# Patient Record
Sex: Male | Born: 1955 | Race: White | Hispanic: No | Marital: Married | Smoking: Former smoker
Health system: Southern US, Community
[De-identification: ages and names within clinical notes are randomized; demographics above are authoritative.]

## PROBLEM LIST (undated history)

## (undated) DIAGNOSIS — I739 Peripheral vascular disease, unspecified: Secondary | ICD-10-CM

## (undated) DIAGNOSIS — I10 Essential (primary) hypertension: Secondary | ICD-10-CM

## (undated) DIAGNOSIS — J942 Hemothorax: Secondary | ICD-10-CM

## (undated) DIAGNOSIS — I639 Cerebral infarction, unspecified: Secondary | ICD-10-CM

## (undated) DIAGNOSIS — E119 Type 2 diabetes mellitus without complications: Secondary | ICD-10-CM

## (undated) DIAGNOSIS — J9611 Chronic respiratory failure with hypoxia: Secondary | ICD-10-CM

## (undated) DIAGNOSIS — I4891 Unspecified atrial fibrillation: Secondary | ICD-10-CM

## (undated) DIAGNOSIS — I5032 Chronic diastolic (congestive) heart failure: Secondary | ICD-10-CM

## (undated) DIAGNOSIS — E785 Hyperlipidemia, unspecified: Secondary | ICD-10-CM

## (undated) DIAGNOSIS — F1010 Alcohol abuse, uncomplicated: Secondary | ICD-10-CM

## (undated) DIAGNOSIS — J449 Chronic obstructive pulmonary disease, unspecified: Secondary | ICD-10-CM

## (undated) HISTORY — DX: Chronic diastolic (congestive) heart failure: I50.32

## (undated) HISTORY — DX: Alcohol abuse, uncomplicated: F10.10

## (undated) HISTORY — DX: Unspecified atrial fibrillation: I48.91

## (undated) HISTORY — DX: Peripheral vascular disease, unspecified: I73.9

## (undated) HISTORY — DX: Chronic respiratory failure with hypoxia: J96.11

## (undated) HISTORY — PX: THORACOTOMY: SUR1349

## (undated) HISTORY — DX: Type 2 diabetes mellitus without complications: E11.9

## (undated) HISTORY — DX: Hyperlipidemia, unspecified: E78.5

## (undated) HISTORY — DX: Hemothorax: J94.2

## (undated) HISTORY — DX: Essential (primary) hypertension: I10

## (undated) HISTORY — DX: Cerebral infarction, unspecified: I63.9

## (undated) HISTORY — DX: Chronic obstructive pulmonary disease, unspecified: J44.9

---

## 2008-08-02 ENCOUNTER — Ambulatory Visit: Payer: Self-pay | Admitting: Cardiology

## 2008-08-04 ENCOUNTER — Encounter: Payer: Self-pay | Admitting: Cardiology

## 2008-08-16 ENCOUNTER — Encounter: Payer: Self-pay | Admitting: Cardiology

## 2008-08-18 ENCOUNTER — Ambulatory Visit: Payer: Self-pay | Admitting: Cardiology

## 2008-08-18 ENCOUNTER — Inpatient Hospital Stay (HOSPITAL_COMMUNITY): Admission: AD | Admit: 2008-08-18 | Discharge: 2008-09-01 | Payer: Self-pay | Admitting: Thoracic Surgery

## 2008-08-18 ENCOUNTER — Ambulatory Visit: Payer: Self-pay | Admitting: Pulmonary Disease

## 2008-08-18 ENCOUNTER — Ambulatory Visit: Payer: Self-pay | Admitting: Thoracic Surgery

## 2008-08-18 HISTORY — PX: OTHER SURGICAL HISTORY: SHX169

## 2008-08-19 ENCOUNTER — Encounter: Payer: Self-pay | Admitting: Thoracic Surgery

## 2008-08-22 ENCOUNTER — Ambulatory Visit: Payer: Self-pay | Admitting: Physical Medicine & Rehabilitation

## 2008-08-22 ENCOUNTER — Encounter: Payer: Self-pay | Admitting: Thoracic Surgery

## 2008-09-01 ENCOUNTER — Inpatient Hospital Stay (HOSPITAL_COMMUNITY)
Admission: RE | Admit: 2008-09-01 | Discharge: 2008-09-10 | Payer: Self-pay | Admitting: Physical Medicine & Rehabilitation

## 2008-09-01 ENCOUNTER — Ambulatory Visit: Payer: Self-pay | Admitting: Internal Medicine

## 2008-09-16 ENCOUNTER — Ambulatory Visit: Payer: Self-pay | Admitting: Thoracic Surgery

## 2008-09-16 ENCOUNTER — Encounter: Admission: RE | Admit: 2008-09-16 | Discharge: 2008-09-16 | Payer: Self-pay | Admitting: Thoracic Surgery

## 2008-09-25 DIAGNOSIS — F341 Dysthymic disorder: Secondary | ICD-10-CM | POA: Insufficient documentation

## 2008-09-25 DIAGNOSIS — J449 Chronic obstructive pulmonary disease, unspecified: Secondary | ICD-10-CM

## 2008-09-25 DIAGNOSIS — I4819 Other persistent atrial fibrillation: Secondary | ICD-10-CM

## 2008-09-25 DIAGNOSIS — J4489 Other specified chronic obstructive pulmonary disease: Secondary | ICD-10-CM | POA: Insufficient documentation

## 2008-09-25 DIAGNOSIS — I6350 Cerebral infarction due to unspecified occlusion or stenosis of unspecified cerebral artery: Secondary | ICD-10-CM | POA: Insufficient documentation

## 2008-09-25 DIAGNOSIS — I10 Essential (primary) hypertension: Secondary | ICD-10-CM

## 2008-09-25 DIAGNOSIS — E119 Type 2 diabetes mellitus without complications: Secondary | ICD-10-CM | POA: Insufficient documentation

## 2008-09-25 DIAGNOSIS — K219 Gastro-esophageal reflux disease without esophagitis: Secondary | ICD-10-CM

## 2008-09-25 DIAGNOSIS — D649 Anemia, unspecified: Secondary | ICD-10-CM

## 2008-09-25 DIAGNOSIS — E669 Obesity, unspecified: Secondary | ICD-10-CM | POA: Insufficient documentation

## 2008-09-29 ENCOUNTER — Encounter: Payer: Self-pay | Admitting: Cardiology

## 2008-09-29 ENCOUNTER — Ambulatory Visit: Payer: Self-pay | Admitting: Cardiology

## 2008-09-29 DIAGNOSIS — E785 Hyperlipidemia, unspecified: Secondary | ICD-10-CM

## 2008-09-29 DIAGNOSIS — I5032 Chronic diastolic (congestive) heart failure: Secondary | ICD-10-CM

## 2008-10-15 ENCOUNTER — Ambulatory Visit: Payer: Self-pay | Admitting: Cardiology

## 2008-10-15 ENCOUNTER — Ambulatory Visit: Payer: Self-pay | Admitting: Internal Medicine

## 2008-10-15 ENCOUNTER — Ambulatory Visit: Payer: Self-pay | Admitting: Thoracic Surgery

## 2008-10-15 ENCOUNTER — Encounter: Admission: RE | Admit: 2008-10-15 | Discharge: 2008-10-15 | Payer: Self-pay | Admitting: Thoracic Surgery

## 2008-10-15 LAB — CONVERTED CEMR LAB
ALT: 31 units/L (ref 0–53)
BUN: 20 mg/dL (ref 6–23)
CO2: 32 meq/L (ref 19–32)
Chloride: 100 meq/L (ref 96–112)
Creatinine, Ser: 1 mg/dL (ref 0.4–1.5)
Glucose, Bld: 240 mg/dL — ABNORMAL HIGH (ref 70–99)
TSH: 1.81 microintl units/mL (ref 0.35–5.50)
Total Protein: 6.7 g/dL (ref 6.0–8.3)

## 2008-10-17 ENCOUNTER — Encounter
Admission: RE | Admit: 2008-10-17 | Discharge: 2008-10-21 | Payer: Self-pay | Admitting: Physical Medicine & Rehabilitation

## 2008-10-21 ENCOUNTER — Encounter: Payer: Self-pay | Admitting: Cardiology

## 2008-10-21 ENCOUNTER — Ambulatory Visit: Payer: Self-pay | Admitting: Cardiology

## 2008-10-21 ENCOUNTER — Ambulatory Visit: Payer: Self-pay | Admitting: Physical Medicine & Rehabilitation

## 2008-10-28 ENCOUNTER — Ambulatory Visit: Payer: Self-pay | Admitting: Cardiology

## 2008-11-04 ENCOUNTER — Ambulatory Visit: Payer: Self-pay | Admitting: Cardiology

## 2008-11-07 ENCOUNTER — Telehealth: Payer: Self-pay | Admitting: Cardiology

## 2008-11-11 ENCOUNTER — Ambulatory Visit: Payer: Self-pay | Admitting: Cardiology

## 2008-11-17 ENCOUNTER — Encounter (INDEPENDENT_AMBULATORY_CARE_PROVIDER_SITE_OTHER): Payer: Self-pay | Admitting: *Deleted

## 2008-11-17 DIAGNOSIS — J984 Other disorders of lung: Secondary | ICD-10-CM

## 2008-11-18 ENCOUNTER — Ambulatory Visit: Payer: Self-pay | Admitting: Cardiology

## 2008-11-27 ENCOUNTER — Ambulatory Visit: Payer: Self-pay | Admitting: Pulmonary Disease

## 2008-12-02 ENCOUNTER — Ambulatory Visit: Payer: Self-pay | Admitting: Cardiology

## 2008-12-16 ENCOUNTER — Ambulatory Visit: Payer: Self-pay | Admitting: Cardiology

## 2008-12-17 ENCOUNTER — Encounter: Payer: Self-pay | Admitting: Cardiology

## 2008-12-17 ENCOUNTER — Ambulatory Visit: Payer: Self-pay | Admitting: Thoracic Surgery

## 2008-12-17 ENCOUNTER — Encounter: Admission: RE | Admit: 2008-12-17 | Discharge: 2008-12-17 | Payer: Self-pay | Admitting: Thoracic Surgery

## 2008-12-17 ENCOUNTER — Ambulatory Visit: Payer: Self-pay | Admitting: Cardiology

## 2008-12-30 ENCOUNTER — Encounter: Payer: Self-pay | Admitting: Cardiology

## 2009-01-02 ENCOUNTER — Telehealth (INDEPENDENT_AMBULATORY_CARE_PROVIDER_SITE_OTHER): Payer: Self-pay | Admitting: *Deleted

## 2009-01-06 ENCOUNTER — Ambulatory Visit: Payer: Self-pay | Admitting: Cardiology

## 2009-01-06 ENCOUNTER — Encounter: Payer: Self-pay | Admitting: Cardiology

## 2009-01-08 ENCOUNTER — Telehealth: Payer: Self-pay | Admitting: Cardiology

## 2009-01-27 ENCOUNTER — Encounter: Payer: Self-pay | Admitting: Cardiology

## 2009-02-03 ENCOUNTER — Telehealth: Payer: Self-pay | Admitting: Cardiology

## 2009-02-03 ENCOUNTER — Ambulatory Visit: Payer: Self-pay | Admitting: Cardiology

## 2009-02-16 ENCOUNTER — Encounter: Payer: Self-pay | Admitting: *Deleted

## 2009-02-16 ENCOUNTER — Telehealth: Payer: Self-pay | Admitting: Cardiology

## 2009-02-24 ENCOUNTER — Ambulatory Visit: Payer: Self-pay | Admitting: Cardiology

## 2009-02-24 LAB — CONVERTED CEMR LAB: Prothrombin Time: 15.9 s

## 2009-03-10 ENCOUNTER — Ambulatory Visit: Payer: Self-pay | Admitting: Cardiology

## 2009-03-17 ENCOUNTER — Encounter: Payer: Self-pay | Admitting: Cardiology

## 2009-03-24 ENCOUNTER — Ambulatory Visit: Payer: Self-pay | Admitting: Cardiology

## 2009-04-07 ENCOUNTER — Ambulatory Visit: Payer: Self-pay | Admitting: Cardiology

## 2009-04-07 LAB — CONVERTED CEMR LAB: POC INR: 1.8

## 2009-04-14 ENCOUNTER — Encounter: Payer: Self-pay | Admitting: Cardiology

## 2009-04-28 ENCOUNTER — Ambulatory Visit: Payer: Self-pay

## 2009-04-28 ENCOUNTER — Encounter: Payer: Self-pay | Admitting: Cardiology

## 2009-05-06 ENCOUNTER — Encounter: Payer: Self-pay | Admitting: Cardiology

## 2009-05-06 ENCOUNTER — Ambulatory Visit: Payer: Self-pay

## 2009-05-19 ENCOUNTER — Ambulatory Visit: Payer: Self-pay | Admitting: Cardiology

## 2009-05-22 ENCOUNTER — Encounter: Payer: Self-pay | Admitting: Cardiology

## 2009-06-09 ENCOUNTER — Ambulatory Visit: Payer: Self-pay | Admitting: Cardiology

## 2009-06-09 LAB — CONVERTED CEMR LAB: POC INR: 2.3

## 2009-07-07 ENCOUNTER — Ambulatory Visit: Payer: Self-pay | Admitting: Cardiology

## 2009-07-07 LAB — CONVERTED CEMR LAB: POC INR: 2.8

## 2009-08-04 ENCOUNTER — Ambulatory Visit: Payer: Self-pay | Admitting: Cardiology

## 2009-08-05 ENCOUNTER — Telehealth: Payer: Self-pay | Admitting: Cardiology

## 2009-09-01 ENCOUNTER — Ambulatory Visit: Payer: Self-pay | Admitting: Cardiology

## 2009-09-01 LAB — CONVERTED CEMR LAB: POC INR: 2.3

## 2009-09-17 ENCOUNTER — Encounter (INDEPENDENT_AMBULATORY_CARE_PROVIDER_SITE_OTHER): Payer: Self-pay | Admitting: *Deleted

## 2009-10-06 ENCOUNTER — Ambulatory Visit: Payer: Self-pay | Admitting: Cardiology

## 2009-10-06 LAB — CONVERTED CEMR LAB: POC INR: 2.3

## 2009-11-03 ENCOUNTER — Ambulatory Visit: Payer: Self-pay | Admitting: Cardiology

## 2009-11-13 ENCOUNTER — Ambulatory Visit: Payer: Self-pay | Admitting: Cardiology

## 2009-12-08 ENCOUNTER — Ambulatory Visit: Payer: Self-pay | Admitting: Cardiology

## 2009-12-15 ENCOUNTER — Telehealth: Payer: Self-pay | Admitting: Cardiology

## 2010-01-12 ENCOUNTER — Ambulatory Visit: Payer: Self-pay | Admitting: Cardiology

## 2010-01-12 LAB — CONVERTED CEMR LAB: POC INR: 2.1

## 2010-01-21 ENCOUNTER — Ambulatory Visit: Payer: Self-pay | Admitting: Cardiology

## 2010-01-27 ENCOUNTER — Encounter: Payer: Self-pay | Admitting: Cardiology

## 2010-01-29 ENCOUNTER — Encounter (INDEPENDENT_AMBULATORY_CARE_PROVIDER_SITE_OTHER): Payer: Self-pay | Admitting: *Deleted

## 2010-02-09 ENCOUNTER — Ambulatory Visit: Payer: Self-pay | Admitting: Cardiology

## 2010-02-23 ENCOUNTER — Ambulatory Visit: Payer: Self-pay | Admitting: Cardiology

## 2010-02-23 LAB — CONVERTED CEMR LAB: POC INR: 1.8

## 2010-03-09 ENCOUNTER — Ambulatory Visit: Payer: Self-pay | Admitting: Cardiology

## 2010-03-09 LAB — CONVERTED CEMR LAB: POC INR: 3.2

## 2010-04-06 ENCOUNTER — Ambulatory Visit: Payer: Self-pay | Admitting: Cardiology

## 2010-04-06 LAB — CONVERTED CEMR LAB: POC INR: 3.7

## 2010-04-27 ENCOUNTER — Ambulatory Visit: Payer: Self-pay | Admitting: Cardiology

## 2010-05-25 ENCOUNTER — Ambulatory Visit: Payer: Self-pay | Admitting: Cardiology

## 2010-06-22 ENCOUNTER — Ambulatory Visit: Payer: Self-pay | Admitting: Cardiology

## 2010-06-22 LAB — CONVERTED CEMR LAB: POC INR: 2.3

## 2010-06-27 IMAGING — CR DG CHEST 1V PORT
1 series · 1 of 1 positions shown · non-contrast
Comparison: Yesterday's exam

CLINICAL DATA: Hemothorax

PORTABLE CHEST - 1 VIEW

[AP]
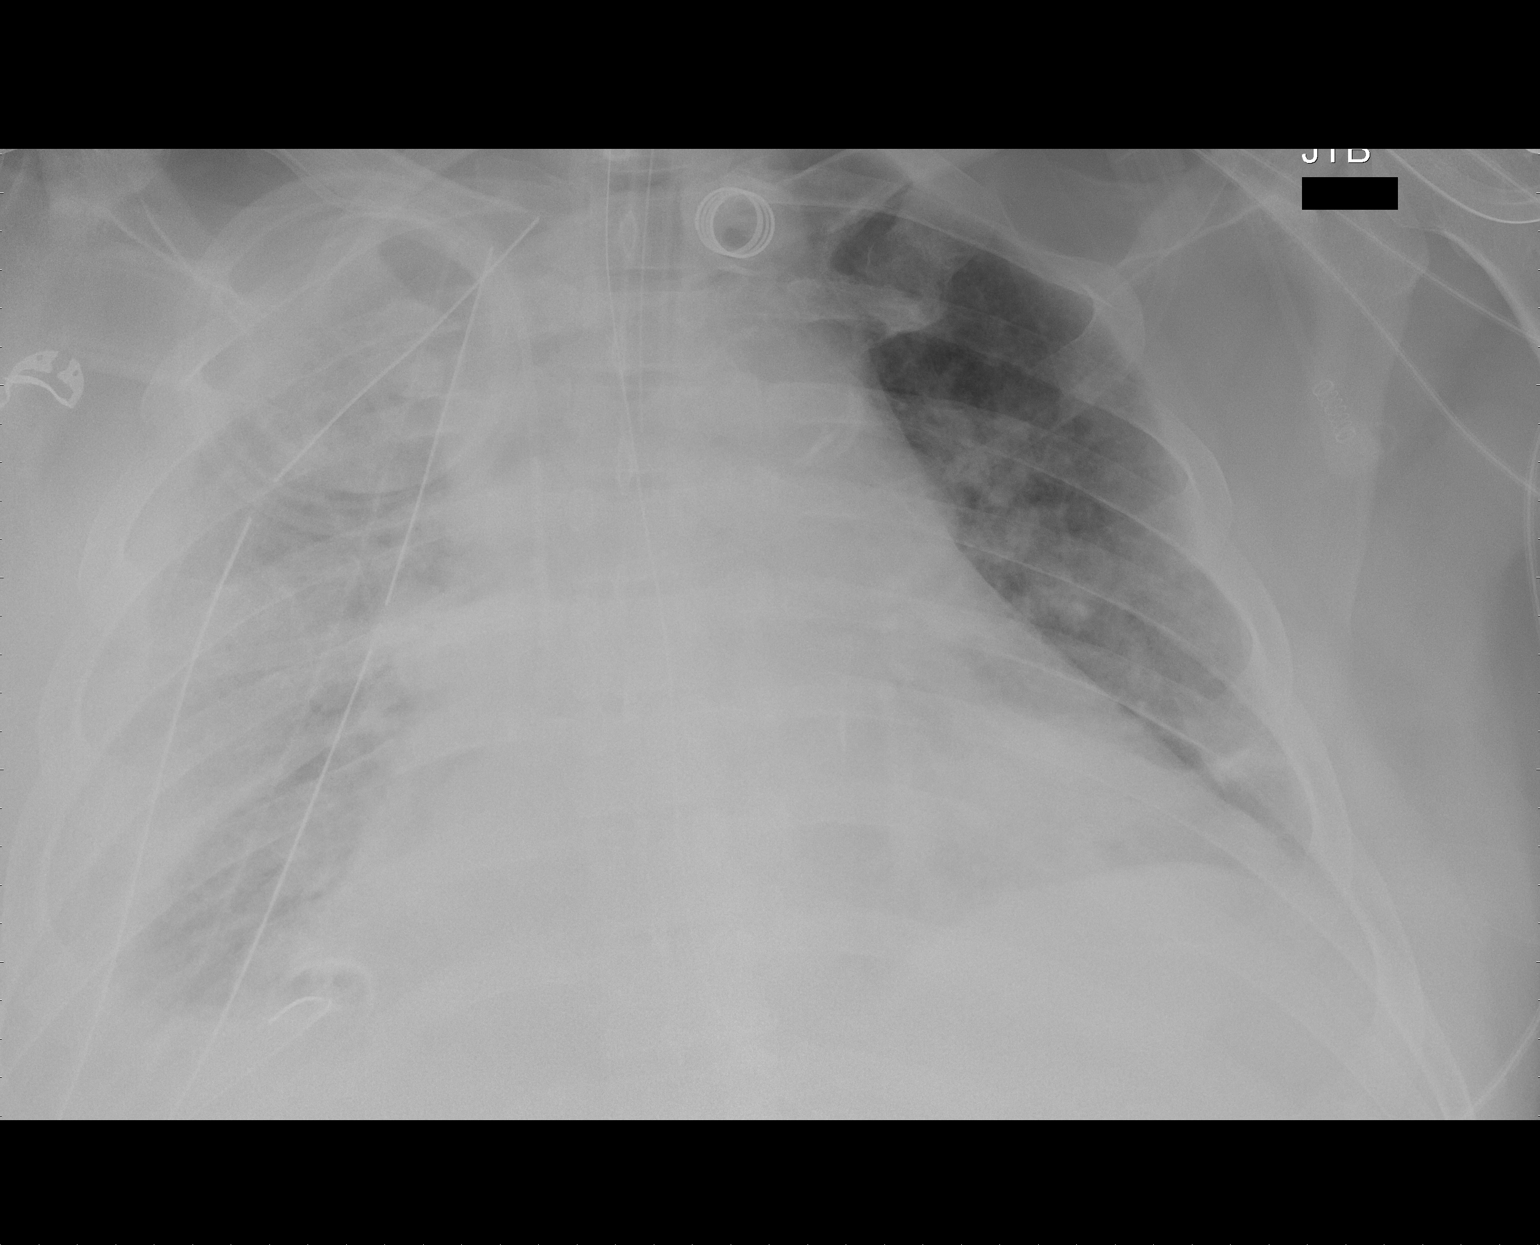

[1 of 1 positions shown; findings below may reference images not displayed]

FINDINGS: Cardiomegaly.  Pulmonary vascular congestion and
perivascular edema.  Increase in right pleural effusion.
Subsegmental atelectasis at the left base. Right pleural chest
tubes unchanged.  ET tube position satisfactory.  NG tube is noted
traversing esophagus and proximal stomach.  Central venous catheter
is in the lower SVC.
IMPRESSION: Vascular congestion and perivascular edema.  Increase in right
pleural effusion.

## 2010-07-03 IMAGING — CR DG CHEST 1V PORT
1 series · 1 of 1 positions shown · non-contrast
Comparison: Portable chest 08/25/2008.

CLINICAL DATA: Hemothorax.  Status post right thoracotomy.

PORTABLE CHEST - 1 VIEW

[view not recorded]
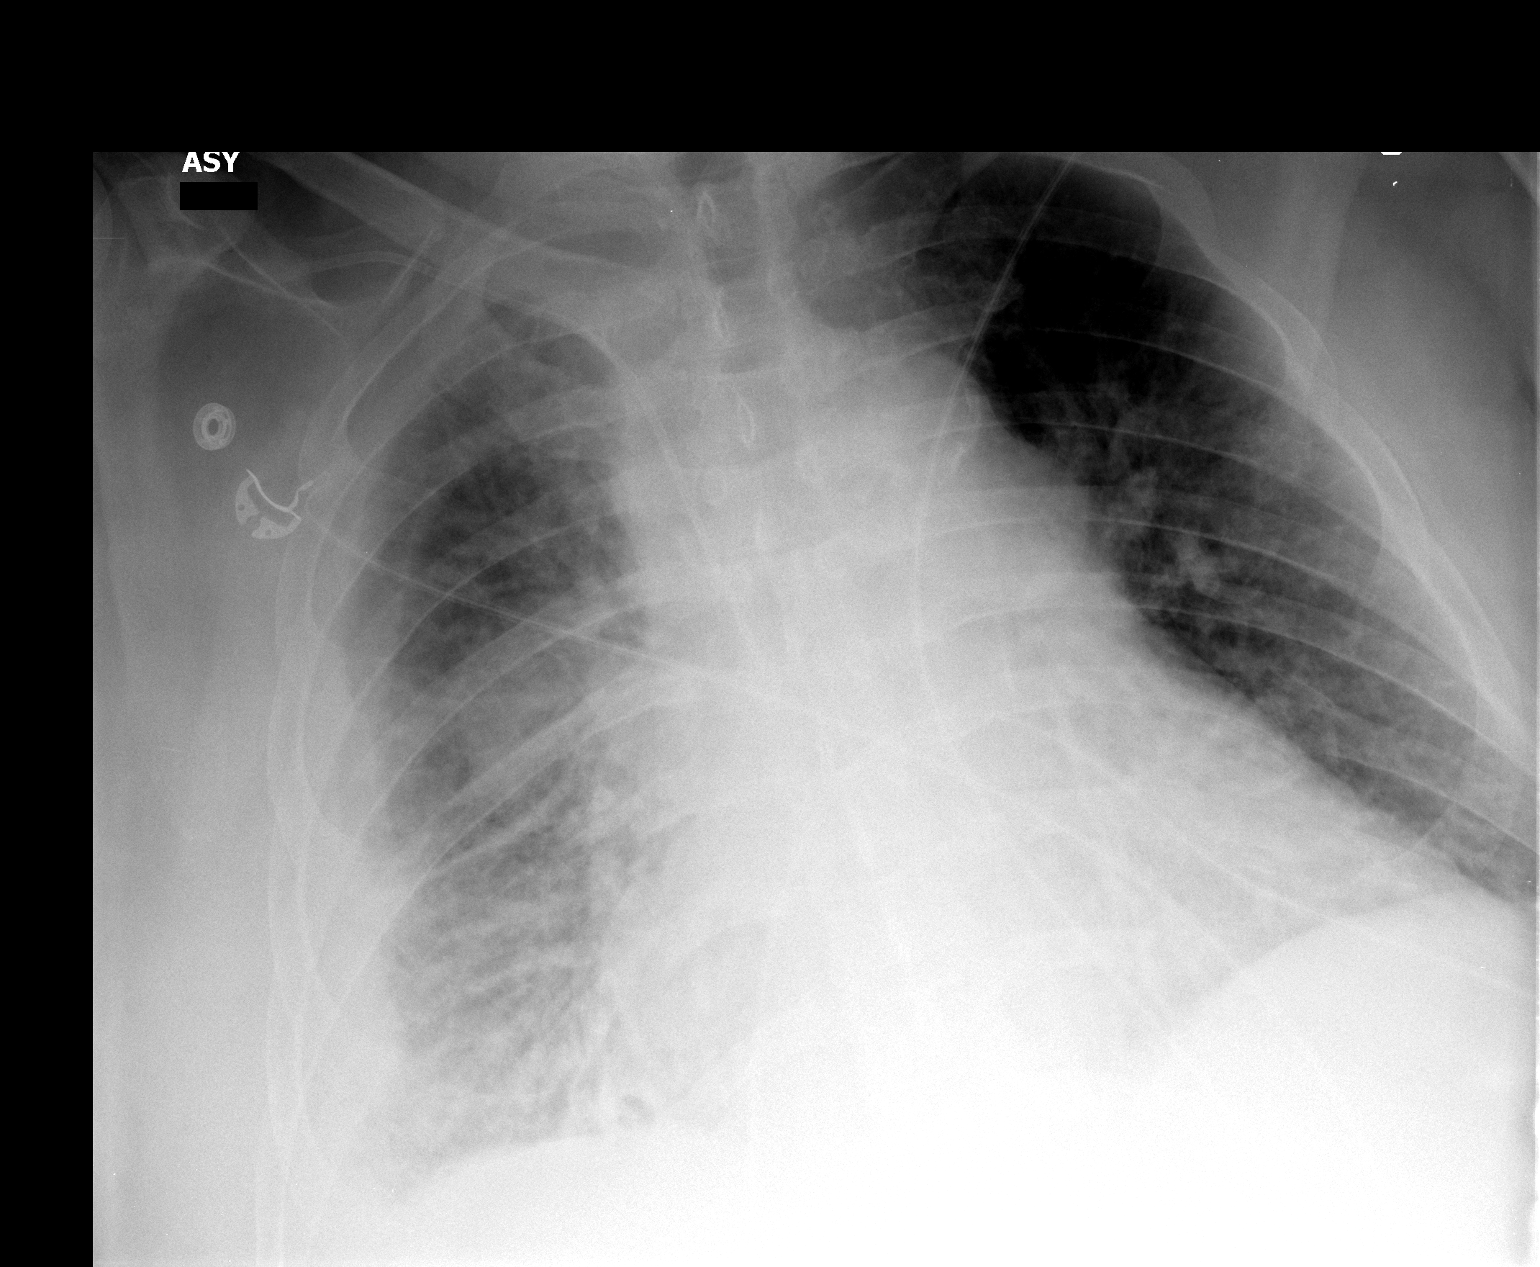

[1 of 1 positions shown; findings below may reference images not displayed]

FINDINGS: The patient's right chest tube has been removed.  No
pneumothorax.  Right pleural fluid collection persists.  Marked
cardiomegaly and pulmonary vascular congestion are unchanged.
IMPRESSION: 1.  Status post removal of right chest tube.  No pneumothorax or
other change.

## 2010-07-04 IMAGING — CR DG CHEST 1V PORT
1 series · 1 of 1 positions shown · non-contrast
Comparison: 08/26/2008

CLINICAL DATA: Hemothorax

PORTABLE CHEST - 1 VIEW

[view not recorded]
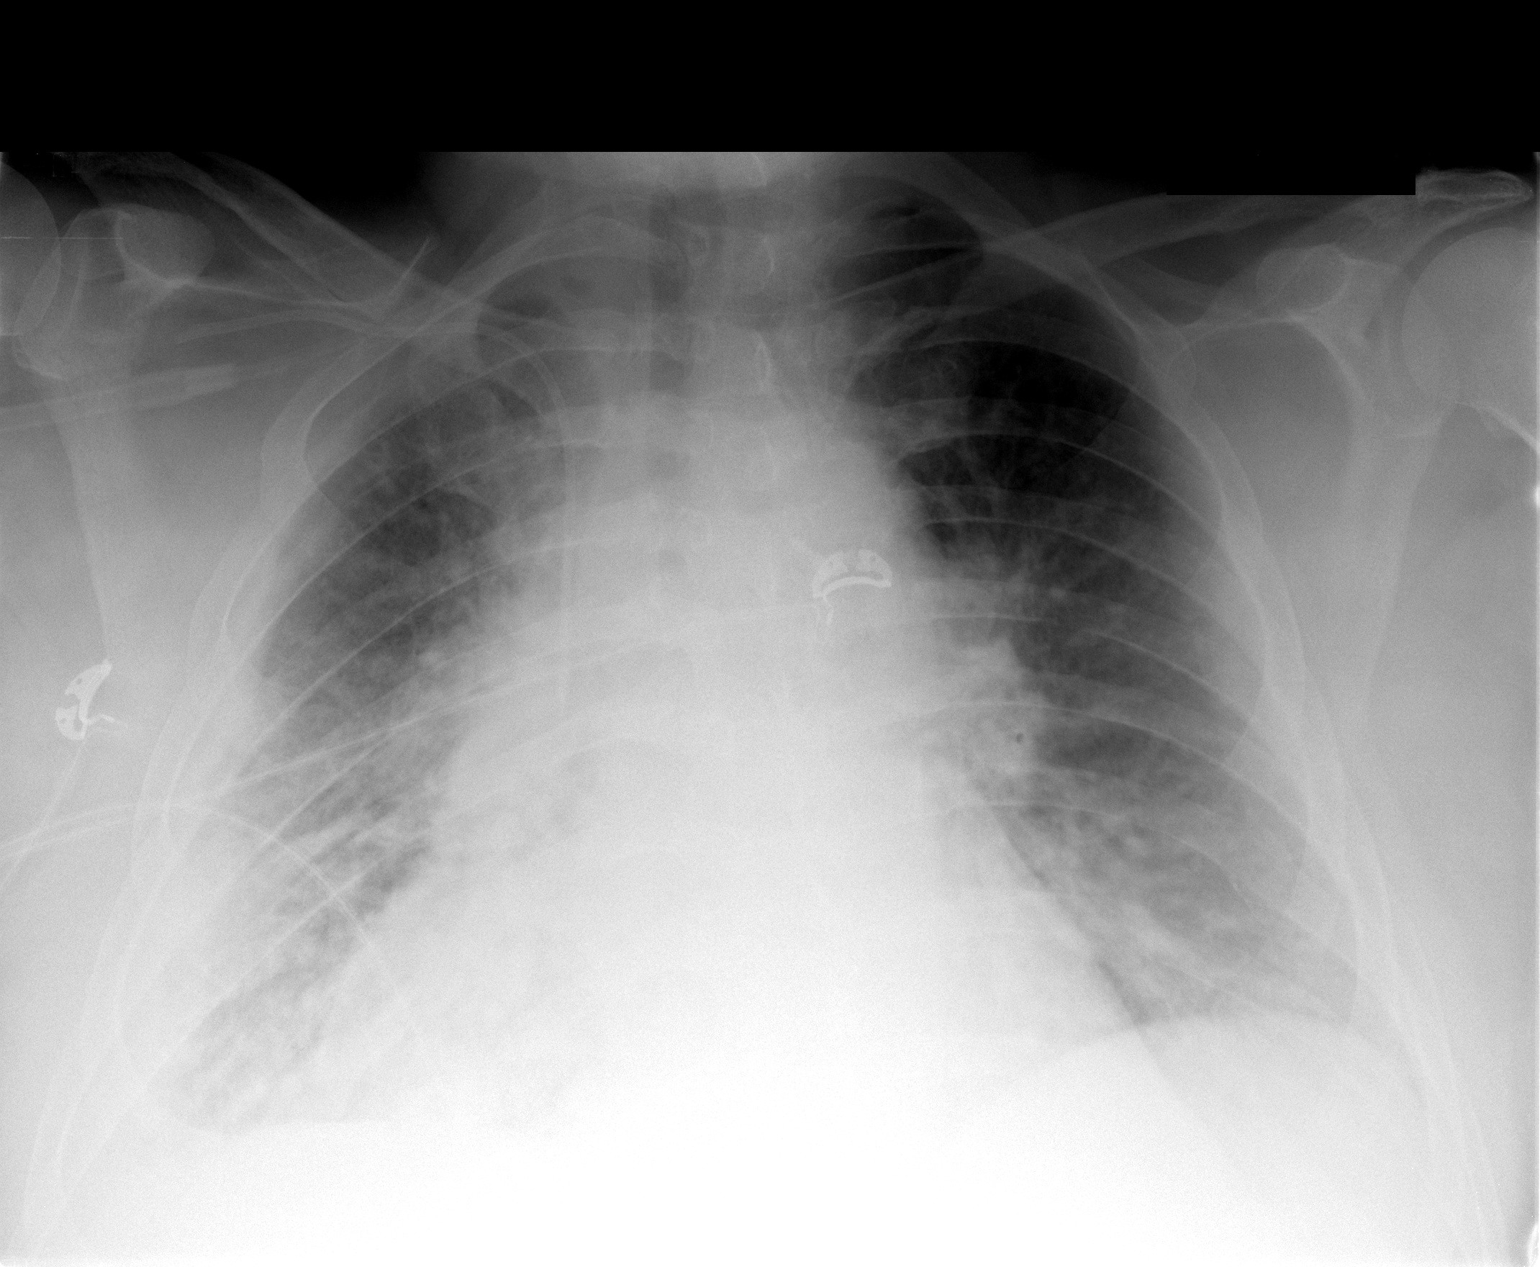

[1 of 1 positions shown; findings below may reference images not displayed]

FINDINGS: Cardiomegaly again noted.  No change in the right
subclavian central venous line position with tip in SVC.  Small
loculated right pleural effusion again noted.  Persistent right
basilar atelectasis or infiltrate.  No diagnostic pneumothorax.
IMPRESSION: Stable right subclavian central line position.  Small loculated
right pleural effusion again noted.  Stable right basilar
atelectasis or infiltrate.

## 2010-07-06 IMAGING — CR DG CHEST 1V PORT
1 series · 1 of 1 positions shown · non-contrast
Comparison: Portable chest 08/28/2008.

CLINICAL DATA: Pneumothorax.  Pneumonia.

PORTABLE CHEST - 1 VIEW

[view not recorded]
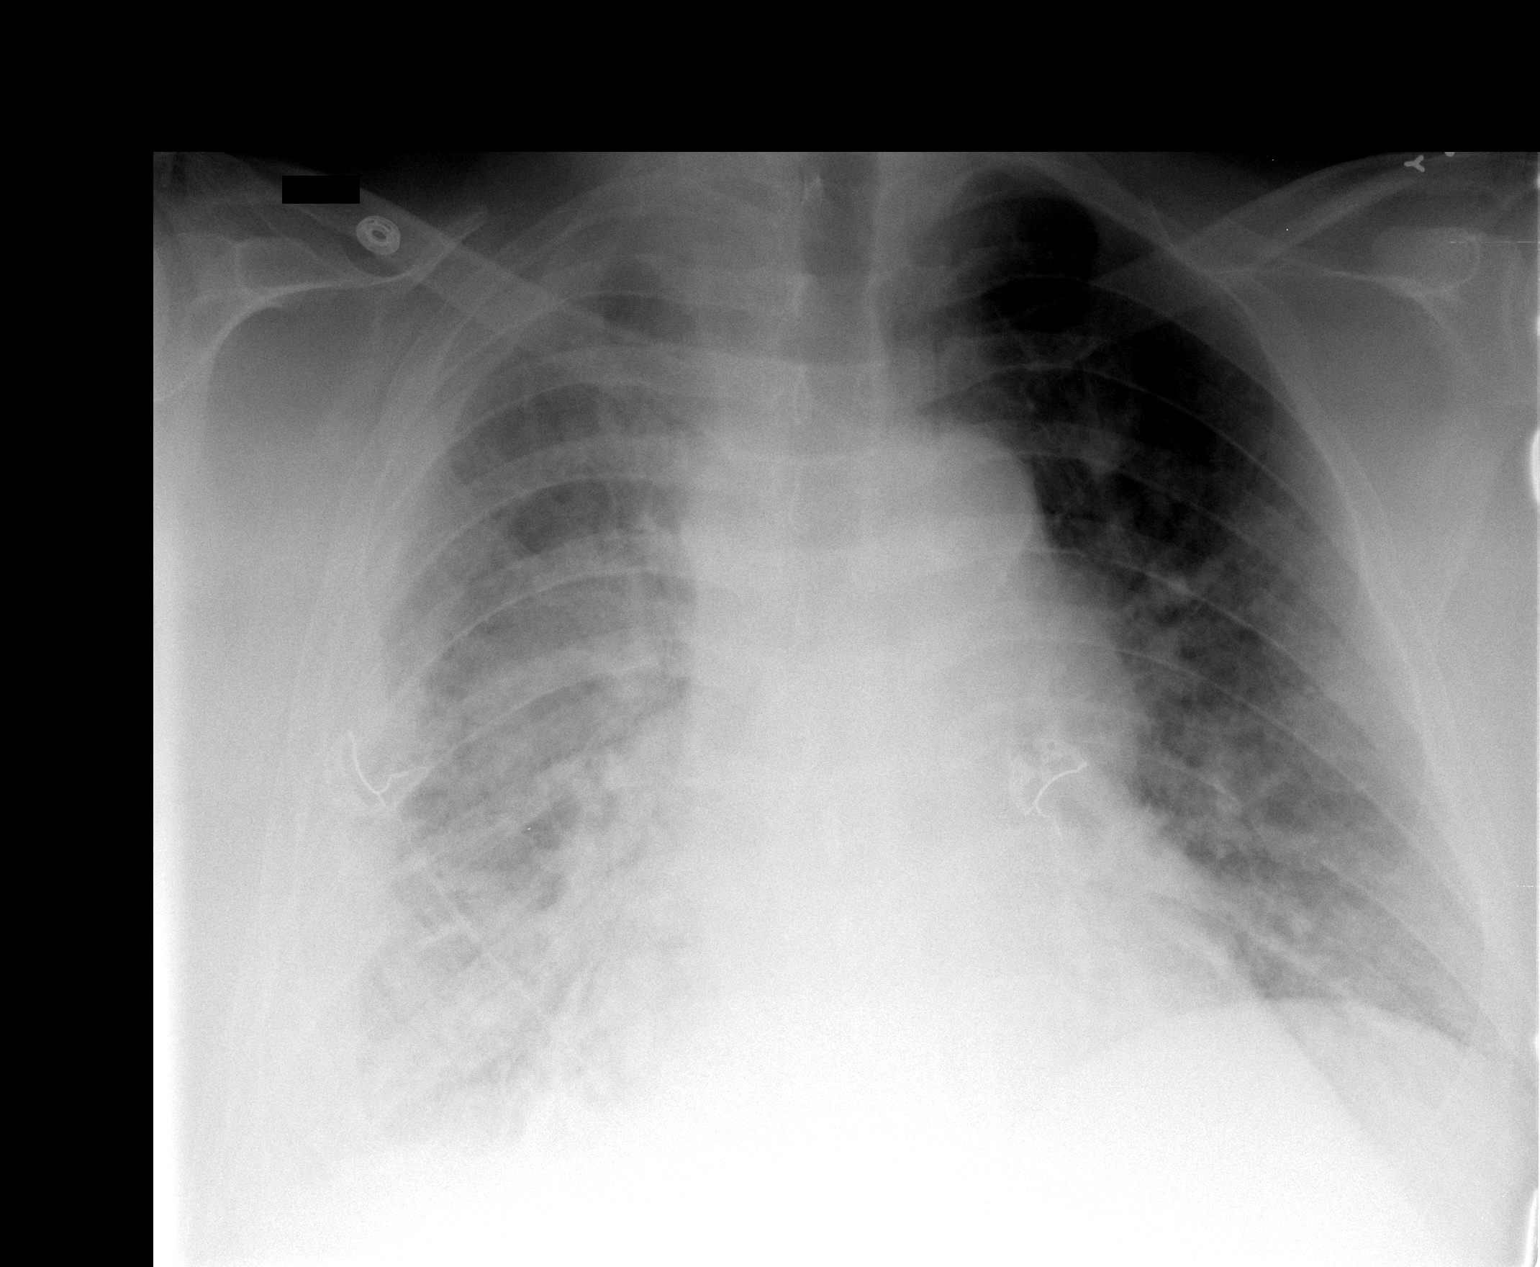

[1 of 1 positions shown; findings below may reference images not displayed]

FINDINGS: Right effusion and airspace disease persist without
change.  Mild left basilar atelectasis again noted.  Cardiomegaly
also again noted.  The patient's right subclavian catheter has been
removed.  No pneumothorax.
IMPRESSION: 1.  Status post removal of right subclavian catheter.
2.  No marked change in right effusion and airspace disease.

## 2010-07-23 ENCOUNTER — Ambulatory Visit: Admission: RE | Admit: 2010-07-23 | Discharge: 2010-07-23 | Payer: Self-pay | Source: Home / Self Care

## 2010-07-23 LAB — CONVERTED CEMR LAB: POC INR: 1.3

## 2010-08-03 ENCOUNTER — Ambulatory Visit: Admission: RE | Admit: 2010-08-03 | Discharge: 2010-08-03 | Payer: Self-pay | Source: Home / Self Care

## 2010-08-03 NOTE — Letter (Signed)
Summary: Appointment - Reminder 2  [facility] [employer], Main Office  [address]   [place], [place] [address]   Phone: [phone]  Fax: [phone]     September 17, 2009 MRN: [id number]   [name] [address] [place], [place]  [address]   Dear Mr. Mourer,  Our records indicate that it is time to schedule a follow-up appointment with Dr. Shirlee Latch. It is very important that we reach you to schedule this appointment. We look forward to participating in your health care needs. Please contact us at the number listed above at your earliest convenience to schedule your appointment.  If you are unable to make an appointment at this time, give [place] a call so we can update our records.     Sincerely,   Migdalia Dk [facility] Scheduling Team

## 2010-08-03 NOTE — Medication Information (Signed)
Summary: ccr-lr  Anticoagulant Therapy  Managed by: Vashti Hey, RN PCP: Meredith Mody, MD Supervising MD: Antoine Poche MD, Fayrene Fearing Indication 1: CVA--stroke (ICD-436) Indication 2: Atrial Fibrillation (ICD-427.31) [facility] [place] Site: [place] INR POC 3.2  Dietary changes: no    Health status changes: no       Details: Thinks heart is out of rhythm again  Having EKG today  Bleeding/hemorrhagic complications: no    Recent/future hospitalizations: no    Any changes in medication regimen? no    Recent/future dental: no  Any missed doses?: no       Is patient compliant with meds? yes       Allergies: 1)  ! Percocet 2)  ! Vicodin 3)  ! Ativan  Anticoagulation Management History:      The patient is taking warfarin and comes in today for a routine follow up visit.  Positive risk factors for bleeding include history of CVA/TIA and presence of serious comorbidities.  Negative risk factors for bleeding include an age less than 37 years old.  The bleeding index is 'intermediate risk'.  Positive CHADS2 values include History of CHF, History of HTN, History of Diabetes, and Prior Stroke/CVA/TIA.  Negative CHADS2 values include Age > 65 years old.  The start date was 10/21/2008.  Anticoagulation responsible provider: Antoine Poche MD, Fayrene Fearing.  INR POC: 3.2.  Cuvette Lot#: [id number].  Exp: 10/11.    Anticoagulation Management Assessment/Plan:      The patient's current anticoagulation dose is Warfarin sodium 5 mg tabs: as directed by coumadin clinic.  The target INR is 2 - 3.  The next INR is due 04/06/2010.  Anticoagulation instructions were given to patient.  Results were reviewed/authorized by Vashti Hey, RN.  He was notified by Vashti Hey RN.         Prior Anticoagulation Instructions: INR 1.8 Take coumadin 3 1/2 tablets tonight then increase dose to 2 tablets once daily except 3 tablets on T,Th,Sat  Current Anticoagulation Instructions: INR 3.2 Take coumadin 10mg  tonight then  decrease dose to 10mg  once daily except 15mg  on Tuesdays and Saturdays Will not come for nect INR appt til 10/4.  States he will be out of town.

## 2010-08-03 NOTE — Medication Information (Signed)
Summary: ccr-lr  Anticoagulant Therapy  Managed by: Vashti Hey, RN PCP: Meredith Mody, MD Supervising MD: Andee Lineman MD, Michelle Piper Indication 1: CVA--stroke (ICD-436) Indication 2: Atrial Fibrillation (ICD-427.31) Lab [facility] [place] Site: [place] INR POC 2.3  Dietary changes: no    Health status changes: no    Bleeding/hemorrhagic complications: no    Recent/future hospitalizations: no    Any changes in medication regimen? no    Recent/future dental: no  Any missed doses?: no       Is patient compliant with meds? yes       Allergies: 1)  ! Percocet 2)  ! Vicodin 3)  ! Ativan  Anticoagulation Management History:      The patient is taking warfarin and comes in today for a routine follow up visit.  Positive risk factors for bleeding include history of CVA/TIA and presence of serious comorbidities.  Negative risk factors for bleeding include an age less than 34 years old.  The bleeding index is 'intermediate risk'.  Positive CHADS2 values include History of CHF, History of HTN, History of Diabetes, and Prior Stroke/CVA/TIA.  Negative CHADS2 values include Age > 56 years old.  The start date was 10/21/2008.  Anticoagulation responsible provider: Andee Lineman MD, Michelle Piper.  INR POC: 2.3.  Cuvette Lot#: [id number].  Exp: 10/11.    Anticoagulation Management Assessment/Plan:      The patient's current anticoagulation dose is Warfarin sodium 5 mg tabs: as directed by coumadin clinic.  The target INR is 2 - 3.  The next INR is due 09/29/2009.  Anticoagulation instructions were given to patient.  Results were reviewed/authorized by Vashti Hey, RN.  He was notified by Vashti Hey RN.         Prior Anticoagulation Instructions: INR 2.2  Continue coumadin 10mg  once daily   Current Anticoagulation Instructions: INR 2.3 Continue coumadin 10mg  once daily

## 2010-08-03 NOTE — Assessment & Plan Note (Signed)
Summary: to estb with degent/[facility]   Visit Type:  Follow-up Referring Provider:  Marca Ancona Primary Provider:  Meredith Mody, MD   History of Present Illness: the patient is a 55 year old male with history of atrial fibrillation status post left middle cerebralartery strokeN. bilateral carotid artery occlusions. He is on Coumadin therapy. His history of alcohol use. He continues to smoke. The patient occasionally she'll experience a speech difficulty. he denies any chest pain. He is not short of breath. He is now transferred his care to our [place] office. we'll check an  Preventive Screening-Counseling & Management  Alcohol-Tobacco     Smoking Status: quit     Year Quit: 08/02/2008  Current Medications (verified): 1)  Simvastatin 40 Mg Tabs (Simvastatin) .... Take 1/2 Tab By Mouth Daily 2)  Sertraline Hcl 50 Mg Tabs (Sertraline Hcl) .... Once Daily 3)  Aspirin Adult Low Strength 81 Mg Tbec (Aspirin) .... Once Daily 4)  Lisinopril 40 Mg Tabs (Lisinopril) .Marland Kitchen.. 1 Daily 5)  Atrovent Hfa 17 Mcg/act Aers (Ipratropium Bromide Hfa) .... 2 Puffs Three Times A Day 6)  Coreg 25 Mg Tabs (Carvedilol) .Marland Kitchen.. 1 Twice A Day 7)  Advair Diskus 250-50 Mcg/dose Misc (Fluticasone-Salmeterol) .Marland Kitchen.. 1 Puff 2 Times Daily 8)  Folic Acid 1 Mg Tabs (Folic Acid) .... Take 1 Tablet By Mouth Once A Day 9)  Multivitamins  Tabs (Multiple Vitamin) .... Take 1 Tablet By Mouth Once A Day 10)  Lasix 20 Mg Tabs (Furosemide) .... Take 1 Tablet By Mouth Once A Day 11)  Procardia Xl 90 Mg Xr24h-Tab (Nifedipine) .Marland Kitchen.. 1 Daily 12)  Metformin Hcl 1000 Mg Tabs (Metformin Hcl) .... Take 1 Tablet By Mouth Two Times A Day 13)  Warfarin Sodium 5 Mg Tabs (Warfarin Sodium) .... As Directed By Coumadin Clinic 14)  Bilberry 60 Mg Caps (Bilberry (Vaccinium Myrtillus)) .... Take 2 Tabs By Mouth Daily 15)  Potassium Gluconate 595 Mg Tabs (Potassium Gluconate) .... Take 2 Tablet By Mouth Two Times A Day 16)  Prilosec Otc 20 Mg Tbec (Omeprazole  Magnesium) .... Take 1 Tablet By Mouth Once A Day 17)  Procardia Xl 30 Mg Xr24h-Tab (Nifedipine) .... Take 1 Daily With Procardia Xl 90mg  18)  Spironolactone 25 Mg Tabs (Spironolactone) .... Take 1/2 Tablet By Mouth Once A Day 19)  Fish Oil 1000 Mg Caps (Omega-3 Fatty Acids) .... Take 2 Tablet By Mouth Two Times A Day 20)  Potassium Chloride Crys Cr 20 Meq Cr-Tabs (Potassium Chloride Crys Cr) .... Take 1 Tablet By Mouth Once A Day 21)  Glimepiride 4 Mg Tabs (Glimepiride) .... Take 1 Tablet By Mouth Once A Day 22)  Alga-K  Caps (Misc Natural Products) .... Take 1 Tablet By Mouth Two Times A Day  Allergies (verified): 1)  ! Percocet 2)  ! Vicodin 3)  ! Ativan  Comments:  Nurse/Medical Assistant: The patient's medication list and allergies were reviewed with the patient and were updated in the Medication and Allergy Lists.  Past History:  Past Medical History: Last updated: 12/17/2008 1.  Atrial fibrillation:  This was first noted in 2/10 when pt presented to [facility] with a stroke.  Pt was initially anticoagulated but developed a hemothorax.  He had CHF with the atrial fibrillation so was cardioverted to NSR.   2.  CVA:  left MCA with right-sided symptoms.  Bilateral carotids noted to be totally occluded on carotid dopplers. Stroke happened in the setting of atrial fibrillation.  3.  ETOH abuse:  Pt drinks a 12-pack  nightly and has been doing so for a long time. 4.  COPD:  pt quit smoking in 2/10.  PFTs showed mixed obstructive and restrictive disease.  Restrictive component was not likely to be due to parenchymal lung disease (probably from post-surgical pain and obesity) as DLCO only mildly decreased.  From pulmonary standpoint, would be OK to use amiodarone if needed.  5.  HTN 6.  DM2:  currently managed only with diet.  7.  Hyperlipidemia 8.  Right Hemothorax 2/10 associated with anticoagulation.  S/p VATS and drainage of hemothorax with decortication (Dr. Edwyna Shell).   9.  Diastolic  CHF:  Echo (2/10):  Ef 55-60%, mild LVH, mild LAE  Past Surgical History: Last updated: 11/27/2008 Right hemothorax with status post video-assisted thoracoscopic       surgery and drainage of hemothorax, August 18, 2008.  Right thoracotomy with extensive decortication as well.  Family History: Last updated: January 13, 2009 His mother is deceased and had diabetes mellitus.  His father is deceased and had an aneurysm.  He has one sister, who is status post CVA.  There is also a strong family history of diabetes and hypertension. No premature CAD.   Social History: Last updated: January 13, 2009 Works in [employer] transport manager.  Lives in [place], [place].   Married with no children Alcohol Use - yes -- 12 pack or more a day for years.  Has now cut back a little to 9-10 daily. Never blacks out.  Drug Use - no Patient states former smoker.  started at age 60.  3 to 4 ppd.  quit 2010.    Risk Factors: Smoking Status: quit (01/21/2010) Packs/Day: 3 to 4 ppd (11/27/2008)  Review of Systems       The patient complains of fatigue and shortness of breath.  The patient denies malaise, fever, weight gain/loss, vision loss, decreased hearing, hoarseness, chest pain, palpitations, prolonged cough, wheezing, sleep apnea, coughing up blood, abdominal pain, blood in stool, nausea, vomiting, diarrhea, heartburn, incontinence, blood in urine, muscle weakness, joint pain, leg swelling, rash, skin lesions, headache, fainting, dizziness, depression, anxiety, enlarged lymph nodes, easy bruising or bleeding, and environmental allergies.    Vital Signs:  Patient profile:   55 year old male Height:      75 inches Weight:      287 pounds Pulse rate:   67 / minute BP sitting:   126 / 71  (left arm) Cuff size:   large  Vitals Entered By: Carlye Grippe (January 21, 2010 3:16 PM)  Physical Exam  Additional Exam:  General: Well-developed, well-nourished in no distress head: Normocephalic and atraumatic eyes PERRLA/EOMI intact,  conjunctiva and lids normal nose: No deformity or lesions mouth normal dentition, normal posterior pharynx neck: Supple, no JVD.  No masses, thyromegaly or abnormal cervical nodes lungs: Normal breath sounds bilaterally without wheezing.  Normal percussion heart: regular rate and rhythm with normal S1 and S2, no S3 or S4.  PMI is normal.  No pathological murmurs abdomen: Normal bowel sounds, abdomen is soft and nontender without masses, organomegaly or hernias noted.  No hepatosplenomegaly musculoskeletal: Back normal, normal gait muscle strength and tone normal pulsus: Pulse is normal in all 4 extremities Extremities: No peripheral pitting edema neurologic: Alert and oriented x 3 skin: Intact without lesions or rashes cervical nodes: No significant adenopathy psychologic: Normal affect    Impression & Recommendations:  Problem # 1:  ATRIAL FIBRILLATION (ICD-427.31) in normal sinus rhythm His updated medication list for this problem includes:    Aspirin Adult  Low Strength 81 Mg Tbec (Aspirin) ..... Once daily    Coreg 25 Mg Tabs (Carvedilol) .Marland Kitchen... 1 twice a day    Warfarin Sodium 5 Mg Tabs (Warfarin sodium) .Marland Kitchen... As directed by coumadin clinic  Orders: EKG w/ Interpretation (93000)  Problem # 2:  CVA (ICD-434.91) on Coumadin. Significant carotid artery disease as outlined above. His updated medication list for this problem includes:    Aspirin Adult Low Strength 81 Mg Tbec (Aspirin) ..... Once daily    Warfarin Sodium 5 Mg Tabs (Warfarin sodium) .Marland Kitchen... As directed by coumadin clinic  Problem # 3:  RESTRICTIVE LUNG DISEASE (ICD-518.89) significant COPD. The patient is unable to afford Spiriva, I switched him to Atrovent 2 puffs t.i.d.  Other Orders: T-Lipid Profile (30160-10932) T-Hepatic Function (35573-22025)  Patient Instructions: 1)  Labs - FLP & LFT, Reminder:  Nothing to eat or drink after 12 midnight prior to labs.  2)  Change Spiriva to Atrovent - 2 puffs three times  a day  3)  Follow up in  6 months  Prescriptions: ATROVENT HFA 17 MCG/ACT AERS (IPRATROPIUM BROMIDE HFA) 2 puffs three times a day  #1 x 1   Entered by:   Hoover Brunette, LPN   Authorized by:   Lewayne Bunting, MD, [facility]   Signed by:   Hoover Brunette, LPN on 42/70/6237   Method used:   Electronically to        [facility]* (retail)       [address]       [place], [place]  [address]       Ph: [phone]       Fax: [phone]   RxID:   [id number]   Handout requested.  Received phone call from wife Idalia Needle) stating they can not afford Atrovent rx.  Placed call to [facility] - no generic for this inhaler and not on $4.00 list.  Pharm stated that his Spiriva was more expensive than the Atrovent.  Spoke with wife again and advised her of this.  Stated that she did have refills on the Spiriva.  Advised her to contact prescribing MD Johny [place]) to see if he could help them out with samples or change him to something cheaper.  She verbalized understanding.   Hoover Brunette, LPN  January 25, 2010 11:46 AM

## 2010-08-03 NOTE — Letter (Signed)
Summary: Engineer, materials at [facility]  [address]   [place], [place] [address]   Phone: [phone]  Fax: [phone]        January 29, 2010 MRN: [id number]   SHAUL TRAUTMAN [address] [place], [place]  [address]   Dear Mr. Purves,  Your test ordered by Selena Batten has been reviewed by your physician (or physician assistant) and was found to be normal or stable. Your physician (or physician assistant) felt no changes were needed at this time.  ____ Echocardiogram  ____ Cardiac Stress Test  __X__ Lab Work  ____ Peripheral vascular study of arms, legs or neck  ____ CT scan or X-ray  ____ Lung or Breathing test  ____ Other:   Thank you.   Hoover Brunette, LPN    Duane Boston, M.D., F.A.C.C. Thressa Sheller, M.D., F.A.C.C. Oneal Grout, M.D., F.A.C.C. Cheree Ditto, M.D., F.A.C.C. Daiva Nakayama, M.D., F.A.C.C. Kenney Houseman, M.D., F.A.C.C. Jeanne Ivan, PA-C

## 2010-08-03 NOTE — Medication Information (Signed)
Summary: ccr-lr  Anticoagulant Therapy  Managed by: Vashti Hey, RN PCP: Meredith Mody, MD Supervising MD: Myrtis Ser MD, Tinnie Gens Indication 1: CVA--stroke (ICD-436) Indication 2: Atrial Fibrillation (ICD-427.31) Lab [facility] [place] Site: [place] INR POC 1.6  Dietary changes: no    Health status changes: no    Bleeding/hemorrhagic complications: no    Recent/future hospitalizations: no    Any changes in medication regimen? no    Recent/future dental: no  Any missed doses?: no       Is patient compliant with meds? yes       Allergies: 1)  ! Percocet 2)  ! Vicodin 3)  ! Ativan  Anticoagulation Management History:      The patient is taking warfarin and comes in today for a routine follow up visit.  Positive risk factors for bleeding include history of CVA/TIA and presence of serious comorbidities.  Negative risk factors for bleeding include an age less than 69 years old.  The bleeding index is 'intermediate risk'.  Positive CHADS2 values include History of CHF, History of HTN, History of Diabetes, and Prior Stroke/CVA/TIA.  Negative CHADS2 values include Age > 58 years old.  The start date was 10/21/2008.  Anticoagulation responsible provider: Myrtis Ser MD, Tinnie Gens.  INR POC: 1.6.  Cuvette Lot#: [id number].  Exp: 10/11.    Anticoagulation Management Assessment/Plan:      The patient's current anticoagulation dose is Warfarin sodium 5 mg tabs: as directed by coumadin clinic.  The target INR is 2 - 3.  The next INR is due 11/27/2009.  Anticoagulation instructions were given to patient.  Results were reviewed/authorized by Vashti Hey, RN.  He was notified by Vashti Hey RN.         Prior Anticoagulation Instructions: INR 1.2 pt states he has only been taking coumadin 1 tablet qd instead of 2 tablets due to hemorrhoids Restart coumadin 10mg  once daily  Pt cautioned about adjusting his coumadin himself and how he is putting himself at increased risk of stroke Pt verbalized  understanding.  Current Anticoagulation Instructions: INR 1.6 Take coumadin 15mg  tonight then increase dose to 10mg  once daily except 15mg  on Tuesdays and Saturdays

## 2010-08-03 NOTE — Medication Information (Signed)
Summary: ccr-lr  Anticoagulant Therapy  Managed by: Vashti Hey, RN PCP: Meredith Mody, MD Supervising MD: Diona Browner MD, Remi Deter Indication 1: CVA--stroke (ICD-436) Indication 2: Atrial Fibrillation (ICD-427.31) Lab [facility] [place] Site: [place] INR POC 1.2  Dietary changes: no    Health status changes: no    Bleeding/hemorrhagic complications: yes       Details: minimal hemorroids  Recent/future hospitalizations: no    Any changes in medication regimen? no    Recent/future dental: no  Any missed doses?: yes     Details: pt states he has only been taking coumadin 1 tablet qd instead of 2 tablets due to hemorrhoids  Is patient compliant with meds? yes      Comments: Told pt if continues to have problems with hemorrhoid, he will need to see PMD.  Allergies: 1)  ! Percocet 2)  ! Vicodin 3)  ! Ativan  Anticoagulation Management History:      The patient is taking warfarin and comes in today for a routine follow up visit.  Positive risk factors for bleeding include history of CVA/TIA and presence of serious comorbidities.  Negative risk factors for bleeding include an age less than 40 years old.  The bleeding index is 'intermediate risk'.  Positive CHADS2 values include History of CHF, History of HTN, History of Diabetes, and Prior Stroke/CVA/TIA.  Negative CHADS2 values include Age > 55 years old.  The start date was 10/21/2008.  Anticoagulation responsible provider: Diona Browner MD, Remi Deter.  INR POC: 1.2.  Cuvette Lot#: [id number].  Exp: 10/11.    Anticoagulation Management Assessment/Plan:      The patient's current anticoagulation dose is Warfarin sodium 5 mg tabs: as directed by coumadin clinic.  The target INR is 2 - 3.  The next INR is due 11/13/2009.  Anticoagulation instructions were given to patient.  Results were reviewed/authorized by Vashti Hey, RN.  He was notified by Vashti Hey RN.         Prior Anticoagulation Instructions: INR 2.3 Continue coumadin 10mg  once  daily   Current Anticoagulation Instructions: INR 1.2 pt states he has only been taking coumadin 1 tablet qd instead of 2 tablets due to hemorrhoids Restart coumadin 10mg  once daily  Pt cautioned about adjusting his coumadin himself and how he is putting himself at increased risk of stroke Pt verbalized understanding.

## 2010-08-03 NOTE — Medication Information (Signed)
Summary: ccr-lr  Anticoagulant Therapy  Managed by: Vashti Hey, RN PCP: Meredith Mody, MD Supervising MD: Andee Lineman MD, Michelle Piper Indication 1: CVA--stroke (ICD-436) Indication 2: Atrial Fibrillation (ICD-427.31) Lab [facility]: [facility] [place] Site: [place] INR POC 2.8  Dietary changes: no    Health status changes: no    Bleeding/hemorrhagic complications: no    Recent/future hospitalizations: no    Any changes in medication regimen? no    Recent/future dental: no  Any missed doses?: no       Is patient compliant with meds? yes       Allergies: 1)  ! Percocet 2)  ! Vicodin 3)  ! Ativan  Anticoagulation Management History:      The patient is taking warfarin and comes in today for a routine follow up visit.  Positive risk factors for bleeding include history of CVA/TIA and presence of serious comorbidities.  Negative risk factors for bleeding include an age less than 46 years old.  The bleeding index is 'intermediate risk'.  Positive CHADS2 values include History of CHF, History of HTN, History of Diabetes, and Prior Stroke/CVA/TIA.  Negative CHADS2 values include Age > 42 years old.  The start date was 10/21/2008.  Anticoagulation responsible provider: Andee Lineman MD, Michelle Piper.  INR POC: 2.8.  Cuvette Lot#: [id number].  Exp: 10/11.    Anticoagulation Management Assessment/Plan:      The patient's current anticoagulation dose is Warfarin sodium 5 mg tabs: as directed by coumadin clinic.  The target INR is 2 - 3.  The next INR is due 08/04/2009.  Anticoagulation instructions were given to patient.  Results were reviewed/authorized by Vashti Hey, RN.  He was notified by Vashti Hey RN.         Prior Anticoagulation Instructions: INR 2.3 Continue coumadin 10g once daily   Current Anticoagulation Instructions: INR 2.8 Continue coumadin 10mg  once daily

## 2010-08-03 NOTE — Progress Notes (Signed)
Summary: Office Visit/ BLOOD PRESSURE READINGS  Office Visit/ BLOOD PRESSURE READINGS   Imported By: Dorise Hiss 01/28/2010 10:46:13  _____________________________________________________________________  External Attachment:    Type:   Image     Comment:   External Document

## 2010-08-03 NOTE — Progress Notes (Signed)
Summary: Calling with question about switching to [facility] office  Phone Note Call from Patient Call back at [facility] Phone [phone] Call back at [phone]   Caller: Patient Summary of Call: Pt calling regarding switching MD to the [facility] office Initial call taken by: Judie Grieve,  December 15, 2009 1:52 PM  Follow-up for Phone Call        talked with wife by telephone--pt is requesting transfer to [facility] office because it is closer to their home--will followup with wife tomorrow about appt in [place]--I talked with Chip Boer in the [place] office--Dr DeGent first appointment is 7/27 and Dr Antoine Poche first appt is 7/1--Vicki was going to talk with Dr Andee Lineman  tomorrow to see about working pt in [facility] will followup with wife tomorrow--I have left message for pt's wife to call me Katina Dung, RN, BSN  December 16, 2009 9:50 AM I talked with pt's wife to let her know Chip Boer is working on an appointment for pt in the [place] office. Luana Shu

## 2010-08-03 NOTE — Medication Information (Signed)
Summary: ccr-lr  Anticoagulant Therapy  Managed by: Vashti Hey, RN PCP: Meredith Mody, MD Supervising MD: Andee Lineman MD, Michelle Piper Indication 1: CVA--stroke (ICD-436) Indication 2: Atrial Fibrillation (ICD-427.31) [facility] [place] Site: [place] INR POC 2.1  Dietary changes: no    Health status changes: no    Bleeding/hemorrhagic complications: no    Recent/future hospitalizations: no    Any changes in medication regimen? no    Recent/future dental: no  Any missed doses?: no       Is patient compliant with meds? yes       Allergies: 1)  ! Percocet 2)  ! Vicodin 3)  ! Ativan  Anticoagulation Management History:      The patient is taking warfarin and comes in today for a routine follow up visit.  Positive risk factors for bleeding include history of CVA/TIA and presence of serious comorbidities.  Negative risk factors for bleeding include an age less than 74 years old.  The bleeding index is 'intermediate risk'.  Positive CHADS2 values include History of CHF, History of HTN, History of Diabetes, and Prior Stroke/CVA/TIA.  Negative CHADS2 values include Age > 3 years old.  The start date was 10/21/2008.  Anticoagulation responsible provider: Andee Lineman MD, Michelle Piper.  INR POC: 2.1.  Exp: 10/11.    Anticoagulation Management Assessment/Plan:      The patient's current anticoagulation dose is Warfarin sodium 5 mg tabs: as directed by coumadin clinic.  The target INR is 2 - 3.  The next INR is due 02/09/2010.  Anticoagulation instructions were given to patient.  Results were reviewed/authorized by Vashti Hey, RN.  He was notified by Vashti Hey RN.         Prior Anticoagulation Instructions: INR 2.6 Continue coumadin 10mg  once daily except 15mg  on Tuesdays and Saturdays  Current Anticoagulation Instructions: INR 2.1 Continue coumadin 10mg  once daily except 15mg  on Tues,Sat

## 2010-08-03 NOTE — Medication Information (Signed)
Summary: ccr-lr  Anticoagulant Therapy  Managed by: Vashti Hey, RN PCP: Meredith Mody, MD Supervising MD: Andee Lineman MD, Michelle Piper Indication 1: CVA--stroke (ICD-436) Indication 2: Atrial Fibrillation (ICD-427.31) [facility] [place] Site: [place] INR POC 2.2  Dietary changes: no    Health status changes: no    Bleeding/hemorrhagic complications: no    Recent/future hospitalizations: no    Any changes in medication regimen? no    Recent/future dental: no  Any missed doses?: no       Is patient compliant with meds? yes       Allergies: 1)  ! Percocet 2)  ! Vicodin 3)  ! Ativan  Anticoagulation Management History:      The patient is taking warfarin and comes in today for a routine follow up visit.  Positive risk factors for bleeding include history of CVA/TIA and presence of serious comorbidities.  Negative risk factors for bleeding include an age less than 4 years old.  The bleeding index is 'intermediate risk'.  Positive CHADS2 values include History of CHF, History of HTN, History of Diabetes, and Prior Stroke/CVA/TIA.  Negative CHADS2 values include Age > 12 years old.  The start date was 10/21/2008.  Anticoagulation responsible provider: Andee Lineman MD, Michelle Piper.  INR POC: 2.2.  Cuvette Lot#: [id number].  Exp: 10/11.    Anticoagulation Management Assessment/Plan:      The patient's current anticoagulation dose is Warfarin sodium 5 mg tabs: as directed by coumadin clinic.  The target INR is 2 - 3.  The next INR is due 09/01/2009.  Anticoagulation instructions were given to patient.  Results were reviewed/authorized by Vashti Hey, RN.  He was notified by Vashti Hey RN.         Prior Anticoagulation Instructions: INR 2.8 Continue coumadin 10mg  once daily   Current Anticoagulation Instructions: INR 2.2  Continue coumadin 10mg  once daily

## 2010-08-03 NOTE — Medication Information (Signed)
Summary: ccr-lr  Anticoagulant Therapy  Managed by: Vashti Hey, RN PCP: Meredith Mody, MD Supervising MD: Andee Lineman MD, Michelle Piper Indication 1: CVA--stroke (ICD-436) Indication 2: Atrial Fibrillation (ICD-427.31) Lab [facility] [place] Site: [place] INR POC 1.8  Dietary changes: no    Health status changes: no    Bleeding/hemorrhagic complications: no    Recent/future hospitalizations: no    Any changes in medication regimen? no    Recent/future dental: no  Any missed doses?: no       Is patient compliant with meds? yes       Allergies: 1)  ! Percocet 2)  ! Vicodin 3)  ! Ativan  Anticoagulation Management History:      The patient is taking warfarin and comes in today for a routine follow up visit.  Positive risk factors for bleeding include history of CVA/TIA and presence of serious comorbidities.  Negative risk factors for bleeding include an age less than 48 years old.  The bleeding index is 'intermediate risk'.  Positive CHADS2 values include History of CHF, History of HTN, History of Diabetes, and Prior Stroke/CVA/TIA.  Negative CHADS2 values include Age > 57 years old.  The start date was 10/21/2008.  Anticoagulation responsible provider: Andee Lineman MD, Michelle Piper.  INR POC: 1.8.  Cuvette Lot#: [id number].  Exp: 10/11.    Anticoagulation Management Assessment/Plan:      The patient's current anticoagulation dose is Warfarin sodium 5 mg tabs: as directed by coumadin clinic.  The target INR is 2 - 3.  The next INR is due 03/09/2010.  Anticoagulation instructions were given to patient.  Results were reviewed/authorized by Vashti Hey, RN.  He was notified by Vashti Hey RN.         Prior Anticoagulation Instructions: INR 1.5 Has only been taking 5mg  for the last 3 nights due to blood tingled sputum Pt to resume 10mg  once daily except 15mg  on Tuesdays and Saturdays Told pt to f/u with PMD if blood tinged sputum returns  Current Anticoagulation Instructions: INR 1.8 Take coumadin 3  1/2 tablets tonight then increase dose to 2 tablets once daily except 3 tablets on T,Th,Sat

## 2010-08-03 NOTE — Medication Information (Signed)
Summary: ccr-lr  Anticoagulant Therapy  Managed by: Vashti Hey, RN PCP: Meredith Mody, MD Supervising MD: Antoine Poche MD, Fayrene Fearing Indication 1: CVA--stroke (ICD-436) Indication 2: Atrial Fibrillation (ICD-427.31) Lab [facility] [place] Site: [place] INR POC 1.5  Dietary changes: no    Health status changes: no    Bleeding/hemorrhagic complications: yes       Details: blood tinged sputum  Recent/future hospitalizations: no    Any changes in medication regimen? no    Recent/future dental: no  Any missed doses?: yes     Details: took 1/2 dose (5mg ) x last 3 days due to blood tinged sputum  Is patient compliant with meds? yes       Allergies: 1)  ! Percocet 2)  ! Vicodin 3)  ! Ativan  Anticoagulation Management History:      The patient is taking warfarin and comes in today for a routine follow up visit.  Positive risk factors for bleeding include history of CVA/TIA and presence of serious comorbidities.  Negative risk factors for bleeding include an age less than 30 years old.  The bleeding index is 'intermediate risk'.  Positive CHADS2 values include History of CHF, History of HTN, History of Diabetes, and Prior Stroke/CVA/TIA.  Negative CHADS2 values include Age > 62 years old.  The start date was 10/21/2008.  Anticoagulation responsible provider: Antoine Poche MD, Fayrene Fearing.  INR POC: 1.5.  Cuvette Lot#: [id number].  Exp: 10/11.    Anticoagulation Management Assessment/Plan:      The patient's current anticoagulation dose is Warfarin sodium 5 mg tabs: as directed by coumadin clinic.  The target INR is 2 - 3.  The next INR is due 02/23/2010.  Anticoagulation instructions were given to patient.  Results were reviewed/authorized by Vashti Hey, RN.  He was notified by Vashti Hey RN.         Prior Anticoagulation Instructions: INR 2.1 Continue coumadin 10mg  once daily except 15mg  on Tues,Sat  Current Anticoagulation Instructions: INR 1.5 Has only been taking 5mg  for the last 3  nights due to blood tingled sputum Pt to resume 10mg  once daily except 15mg  on Tuesdays and Saturdays Told pt to f/u with PMD if blood tinged sputum returns

## 2010-08-03 NOTE — Medication Information (Signed)
Summary: ccr-lr  Anticoagulant Therapy  Managed by: Vashti Hey, RN PCP: Meredith Mody, MD Supervising MD: Diona Browner MD, Remi Deter Indication 1: CVA--stroke (ICD-436) Indication 2: Atrial Fibrillation (ICD-427.31) Lab [facility]: [facility] [place] Site: [place] INR POC 2.6  Dietary changes: no    Health status changes: no    Bleeding/hemorrhagic complications: no    Recent/future hospitalizations: no    Any changes in medication regimen? no    Recent/future dental: no  Any missed doses?: no       Is patient compliant with meds? yes       Allergies: 1)  ! Percocet 2)  ! Vicodin 3)  ! Ativan  Anticoagulation Management History:      The patient is taking warfarin and comes in today for a routine follow up visit.  Positive risk factors for bleeding include history of CVA/TIA and presence of serious comorbidities.  Negative risk factors for bleeding include an age less than 38 years old.  The bleeding index is 'intermediate risk'.  Positive CHADS2 values include History of CHF, History of HTN, History of Diabetes, and Prior Stroke/CVA/TIA.  Negative CHADS2 values include Age > 87 years old.  The start date was 10/21/2008.  Anticoagulation responsible provider: Diona Browner MD, Remi Deter.  INR POC: 2.6.  Exp: 10/11.    Anticoagulation Management Assessment/Plan:      The patient's current anticoagulation dose is Warfarin sodium 5 mg tabs: as directed by coumadin clinic.  The target INR is 2 - 3.  The next INR is due 01/12/2010.  Anticoagulation instructions were given to patient.  Results were reviewed/authorized by Vashti Hey, RN.  He was notified by Vashti Hey RN.         Prior Anticoagulation Instructions: INR 1.6 Take coumadin 15mg  tonight then increase dose to 10mg  once daily except 15mg  on Tuesdays and Saturdays  Current Anticoagulation Instructions: INR 2.6 Continue coumadin 10mg  once daily except 15mg  on Tuesdays and Saturdays

## 2010-08-03 NOTE — Medication Information (Signed)
Summary: ccr-lr  Anticoagulant Therapy  Managed by: Vashti Hey, RN PCP: Meredith Mody, MD Supervising MD: Diona Browner MD, Remi Deter Indication 1: CVA--stroke (ICD-436) Indication 2: Atrial Fibrillation (ICD-427.31) Lab [facility]: [facility] [place] Site: [place] INR POC 2.3  Dietary changes: no    Health status changes: no    Bleeding/hemorrhagic complications: no    Recent/future hospitalizations: no    Any changes in medication regimen? no    Recent/future dental: no  Any missed doses?: no       Is patient compliant with meds? yes       Allergies: 1)  ! Percocet 2)  ! Vicodin 3)  ! Ativan  Anticoagulation Management History:      The patient is taking warfarin and comes in today for a routine follow up visit.  Positive risk factors for bleeding include history of CVA/TIA and presence of serious comorbidities.  Negative risk factors for bleeding include an age less than 18 years old.  The bleeding index is 'intermediate risk'.  Positive CHADS2 values include History of CHF, History of HTN, History of Diabetes, and Prior Stroke/CVA/TIA.  Negative CHADS2 values include Age > 80 years old.  The start date was 10/21/2008.  Anticoagulation responsible provider: Diona Browner MD, Remi Deter.  INR POC: 2.3.  Cuvette Lot#: [id number].  Exp: 10/11.    Anticoagulation Management Assessment/Plan:      The patient's current anticoagulation dose is Warfarin sodium 5 mg tabs: as directed by coumadin clinic.  The target INR is 2 - 3.  The next INR is due 06/22/2010.  Anticoagulation instructions were given to patient.  Results were reviewed/authorized by Vashti Hey, RN.  He was notified by Vashti Hey RN.         Prior Anticoagulation Instructions: INR 3.5 Decrease coumadin 10mg  once daily except 15mg  on Saturdays  Current Anticoagulation Instructions: INR 2.3 Continue coumadin 10mg  once daily except 15mg  on Saturday

## 2010-08-03 NOTE — Progress Notes (Signed)
Summary: coumadin Rx  Phone Note Refill Request Call back at Home Phone [phone] Message from:  Patient on August 05, 2009 11:04 AM  Refills Requested: Medication #1:  WARFARIN SODIUM 5 MG TABS as directed by coumadin clinic [facility] [phone]   Method Requested: Fax to Mail Away Pharmacy Initial call taken by: Lorne Skeens,  August 05, 2009 11:04 AM Caller: Spouse Reason for Call: Talk to Nurse    Prescriptions: WARFARIN SODIUM 5 MG TABS (WARFARIN SODIUM) as directed by coumadin clinic  #60 x 6   Entered by:   Vashti Hey RN   Authorized by:   Marca Ancona, MD   Signed by:   Vashti Hey RN on 08/06/2009   Method used:   Electronically to        [facility] (retail)       [address]       [place], [place]  [address]       Ph: [phone]       Fax: [phone]   RxID:   [id number]

## 2010-08-03 NOTE — Medication Information (Signed)
Summary: ccr-lr  Anticoagulant Therapy  Managed by: Vashti Hey, RN PCP: Meredith Mody, MD Supervising MD: Antoine Poche MD, Fayrene Fearing Indication 1: CVA--stroke (ICD-436) Indication 2: Atrial Fibrillation (ICD-427.31) Lab [facility]: [facility] [place] Site: [place] INR POC 2.3  Dietary changes: no    Health status changes: no    Bleeding/hemorrhagic complications: yes       Details: had episode of rectal bleeding 2 weeks ago  Pt states blood was only on tissue not in toilet  Recent/future hospitalizations: no    Any changes in medication regimen? no    Recent/future dental: no  Any missed doses?: yes     Details: Wife decreased coumadin dose while pt had some rectal bleeding.  Gave him 1 pill instead of 2    Is patient compliant with meds? yes      Comments: Told pt if rectal bleeding returns he will need to see PMD to determine source.  Pt states he has never had a colonoscopy.  He agrees to call PMD if bleeding returns.  Explained risks of not calling if problem persist.  Allergies: 1)  ! Percocet 2)  ! Vicodin 3)  ! Ativan  Anticoagulation Management History:      The patient is taking warfarin and comes in today for a routine follow up visit.  Positive risk factors for bleeding include history of CVA/TIA and presence of serious comorbidities.  Negative risk factors for bleeding include an age less than 40 years old.  The bleeding index is 'intermediate risk'.  Positive CHADS2 values include History of CHF, History of HTN, History of Diabetes, and Prior Stroke/CVA/TIA.  Negative CHADS2 values include Age > 44 years old.  The start date was 10/21/2008.  Anticoagulation responsible provider: Antoine Poche MD, Fayrene Fearing.  INR POC: 2.3.  Cuvette Lot#: [id number].  Exp: 10/11.    Anticoagulation Management Assessment/Plan:      The patient's current anticoagulation dose is Warfarin sodium 5 mg tabs: as directed by coumadin clinic.  The target INR is 2 - 3.  The next INR is due 11/03/2009.   Anticoagulation instructions were given to patient.  Results were reviewed/authorized by Vashti Hey, RN.  He was notified by Vashti Hey RN.         Prior Anticoagulation Instructions: INR 2.3 Continue coumadin 10mg  once daily     Current Anticoagulation Instructions: INR 2.3 Continue coumadin 10mg  once daily

## 2010-08-03 NOTE — Medication Information (Signed)
Summary: ccr-lr  Anticoagulant Therapy  Managed by: Vashti Hey, RN PCP: Meredith Mody, MD Supervising MD: Diona Browner MD, Remi Deter Indication 1: CVA--stroke (ICD-436) Indication 2: Atrial Fibrillation (ICD-427.31) Lab [facility] [place] Site: [place] INR POC 3.5  Dietary changes: no    Health status changes: no    Bleeding/hemorrhagic complications: no    Recent/future hospitalizations: no    Any changes in medication regimen? no    Recent/future dental: no  Any missed doses?: no       Is patient compliant with meds? yes       Allergies: 1)  ! Percocet 2)  ! Vicodin 3)  ! Ativan  Anticoagulation Management History:      The patient is taking warfarin and comes in today for a routine follow up visit.  Positive risk factors for bleeding include history of CVA/TIA and presence of serious comorbidities.  Negative risk factors for bleeding include an age less than 86 years old.  The bleeding index is 'intermediate risk'.  Positive CHADS2 values include History of CHF, History of HTN, History of Diabetes, and Prior Stroke/CVA/TIA.  Negative CHADS2 values include Age > 25 years old.  The start date was 10/21/2008.  Anticoagulation responsible provider: Diona Browner MD, Remi Deter.  INR POC: 3.5.  Cuvette Lot#: [id number].  Exp: 10/11.    Anticoagulation Management Assessment/Plan:      The patient's current anticoagulation dose is Warfarin sodium 5 mg tabs: as directed by coumadin clinic.  The target INR is 2 - 3.  The next INR is due 05/25/2010.  Anticoagulation instructions were given to patient.  Results were reviewed/authorized by Vashti Hey, RN.  He was notified by Vashti Hey RN.         Prior Anticoagulation Instructions: INR 3.7 Hold coumadin tonight then resume 10mg  once daily except 15mg  on Tuesdays and Saturdays  Current Anticoagulation Instructions: INR 3.5 Decrease coumadin 10mg  once daily except 15mg  on Saturdays

## 2010-08-03 NOTE — Medication Information (Signed)
Summary: ccr-lr  Anticoagulant Therapy  Managed by: Vashti Hey, RN PCP: Meredith Mody, MD Supervising MD: Andee Lineman MD, Michelle Piper Indication 1: CVA--stroke (ICD-436) Indication 2: Atrial Fibrillation (ICD-427.31) Lab [facility] [place] Site: [place] INR POC 3.7  Dietary changes: no    Health status changes: no    Bleeding/hemorrhagic complications: no    Recent/future hospitalizations: no    Any changes in medication regimen? no    Recent/future dental: no  Any missed doses?: yes     Details: missed 1 dose  Is patient compliant with meds? yes       Allergies: 1)  ! Percocet 2)  ! Vicodin 3)  ! Ativan  Anticoagulation Management History:      The patient is taking warfarin and comes in today for a routine follow up visit.  Positive risk factors for bleeding include history of CVA/TIA and presence of serious comorbidities.  Negative risk factors for bleeding include an age less than 3 years old.  The bleeding index is 'intermediate risk'.  Positive CHADS2 values include History of CHF, History of HTN, History of Diabetes, and Prior Stroke/CVA/TIA.  Negative CHADS2 values include Age > 60 years old.  The start date was 10/21/2008.  Anticoagulation responsible provider: Andee Lineman MD, Michelle Piper.  INR POC: 3.7.  Cuvette Lot#: [id number].  Exp: 10/11.    Anticoagulation Management Assessment/Plan:      The patient's current anticoagulation dose is Warfarin sodium 5 mg tabs: as directed by coumadin clinic.  The target INR is 2 - 3.  The next INR is due 04/27/2010.  Anticoagulation instructions were given to patient.  Results were reviewed/authorized by Vashti Hey, RN.  He was notified by Vashti Hey RN.         Prior Anticoagulation Instructions: INR 3.2 Take coumadin 10mg  tonight then decrease dose to 10mg  once daily except 15mg  on Tuesdays and Saturdays Will not come for nect INR appt til 10/4.  States he will be out of town.  Current Anticoagulation Instructions: INR 3.7 Hold coumadin  tonight then resume 10mg  once daily except 15mg  on Tuesdays and Saturdays

## 2010-08-05 NOTE — Medication Information (Signed)
Summary: ccr-lr  Anticoagulant Therapy  Managed by: Vashti Hey, RN PCP: Meredith Mody, MD Supervising MD: Diona Browner MD, Remi Deter Indication 1: CVA--stroke (ICD-436) Indication 2: Atrial Fibrillation (ICD-427.31) Lab [facility] [place] Site: [place] INR POC 2.3  Dietary changes: no    Health status changes: no    Bleeding/hemorrhagic complications: no    Recent/future hospitalizations: no    Any changes in medication regimen? no    Recent/future dental: no  Any missed doses?: no       Is patient compliant with meds? yes       Allergies: 1)  ! Percocet 2)  ! Vicodin 3)  ! Ativan  Anticoagulation Management History:      The patient is taking warfarin and comes in today for a routine follow up visit.  Positive risk factors for bleeding include history of CVA/TIA and presence of serious comorbidities.  Negative risk factors for bleeding include an age less than 29 years old.  The bleeding index is 'intermediate risk'.  Positive CHADS2 values include History of CHF, History of HTN, History of Diabetes, and Prior Stroke/CVA/TIA.  Negative CHADS2 values include Age > 39 years old.  The start date was 10/21/2008.  Anticoagulation responsible provider: Diona Browner MD, Remi Deter.  INR POC: 2.3.  Cuvette Lot#: [id number].  Exp: 10/11.    Anticoagulation Management Assessment/Plan:      The patient's current anticoagulation dose is Warfarin sodium 5 mg tabs: as directed by coumadin clinic.  The target INR is 2 - 3.  The next INR is due 07/20/2010.  Anticoagulation instructions were given to patient.  Results were reviewed/authorized by Vashti Hey, RN.  He was notified by Vashti Hey RN.         Prior Anticoagulation Instructions: INR 2.3 Continue coumadin 10mg  once daily except 15mg  on Saturday  Current Anticoagulation Instructions: INR 2.3 Continue coumadin 10mg  once daily except 15mg  on Saturdays

## 2010-08-05 NOTE — Medication Information (Signed)
Summary: ccr-lr  Anticoagulant Therapy  Managed by: Vashti Hey, RN PCP: Meredith Mody, MD Supervising MD: Andee Lineman MD, Michelle Piper Indication 1: CVA--stroke (ICD-436) Indication 2: Atrial Fibrillation (ICD-427.31) [facility] Site: [place] INR POC 1.3  Dietary changes: no    Health status changes: yes       Details: has cold  Bleeding/hemorrhagic complications: no    Recent/future hospitalizations: no    Any changes in medication regimen? yes       Details: been taking cold medicine  Recent/future dental: no  Any missed doses?: yes     Details: wife cut coumadin dose in half while taking cold meds  Is patient compliant with meds? yes       Allergies: 1)  ! Percocet 2)  ! Vicodin 3)  ! Ativan  Anticoagulation Management History:      The patient is taking warfarin and comes in today for a routine follow up visit.  Positive risk factors for bleeding include history of CVA/TIA and presence of serious comorbidities.  Negative risk factors for bleeding include an age less than 4 years old.  The bleeding index is 'intermediate risk'.  Positive CHADS2 values include History of CHF, History of HTN, History of Diabetes, and Prior Stroke/CVA/TIA.  Negative CHADS2 values include Age > 46 years old.  The start date was 10/21/2008.  Anticoagulation responsible provider: Andee Lineman MD, Michelle Piper.  INR POC: 1.3.  Cuvette Lot#: [id number].  Exp: 10/11.    Anticoagulation Management Assessment/Plan:      The patient's current anticoagulation dose is Warfarin sodium 5 mg tabs: as directed by coumadin clinic.  The target INR is 2 - 3.  The next INR is due 08/03/2010.  Anticoagulation instructions were given to patient.  Results were reviewed/authorized by Vashti Hey, RN.  He was notified by Vashti Hey RN.         Prior Anticoagulation Instructions: INR 2.3 Continue coumadin 10mg  once daily except 15mg  on Saturdays  Current Anticoagulation Instructions: INR 1.3 Take coumadin 3 tablets  tonight and tomorrow night then resume 2 tablets once daily except 3 tablets on Saturdays

## 2010-08-10 ENCOUNTER — Ambulatory Visit (INDEPENDENT_AMBULATORY_CARE_PROVIDER_SITE_OTHER): Payer: PRIVATE HEALTH INSURANCE | Admitting: Cardiology

## 2010-08-10 ENCOUNTER — Encounter: Payer: Self-pay | Admitting: Cardiology

## 2010-08-10 ENCOUNTER — Encounter (INDEPENDENT_AMBULATORY_CARE_PROVIDER_SITE_OTHER): Payer: Self-pay | Admitting: *Deleted

## 2010-08-10 DIAGNOSIS — N400 Enlarged prostate without lower urinary tract symptoms: Secondary | ICD-10-CM | POA: Insufficient documentation

## 2010-08-10 DIAGNOSIS — R0602 Shortness of breath: Secondary | ICD-10-CM

## 2010-08-10 DIAGNOSIS — R072 Precordial pain: Secondary | ICD-10-CM | POA: Insufficient documentation

## 2010-08-10 DIAGNOSIS — I4891 Unspecified atrial fibrillation: Secondary | ICD-10-CM

## 2010-08-11 NOTE — Medication Information (Signed)
Summary: ccr-lr  Anticoagulant Therapy  Managed by: Vashti Hey, RN PCP: Meredith Mody, MD Supervising MD: Diona Browner MD, Remi Deter Indication 1: CVA--stroke (ICD-436) Indication 2: Atrial Fibrillation (ICD-427.31) Lab [facility] Site: [place] INR POC 2.7  Dietary changes: no    Health status changes: no    Bleeding/hemorrhagic complications: no    Recent/future hospitalizations: no    Any changes in medication regimen? no    Recent/future dental: no  Any missed doses?: no       Is patient compliant with meds? yes       Allergies: 1)  ! Percocet 2)  ! Vicodin 3)  ! Ativan  Anticoagulation Management History:      The patient is taking warfarin and comes in today for a routine follow up visit.  Positive risk factors for bleeding include history of CVA/TIA and presence of serious comorbidities.  Negative risk factors for bleeding include an age less than 82 years old.  The bleeding index is 'intermediate risk'.  Positive CHADS2 values include History of CHF, History of HTN, History of Diabetes, and Prior Stroke/CVA/TIA.  Negative CHADS2 values include Age > 35 years old.  The start date was 10/21/2008.  Anticoagulation responsible provider: Diona Browner MD, Remi Deter.  INR POC: 2.7.  Cuvette Lot#: [id number].  Exp: 10/11.    Anticoagulation Management Assessment/Plan:      The patient's current anticoagulation dose is Warfarin sodium 5 mg tabs: as directed by coumadin clinic.  The target INR is 2 - 3.  The next INR is due 08/31/2010.  Anticoagulation instructions were given to patient.  Results were reviewed/authorized by Vashti Hey, RN.  He was notified by Vashti Hey RN.         Prior Anticoagulation Instructions: INR 1.3 Take coumadin 3 tablets tonight and tomorrow night then resume 2 tablets once daily except 3 tablets on Saturdays  Current Anticoagulation Instructions: INR 2.7 Continue coumadin 10mg  once daily except 15mg  on Saturdays

## 2010-08-12 ENCOUNTER — Telehealth (INDEPENDENT_AMBULATORY_CARE_PROVIDER_SITE_OTHER): Payer: Self-pay | Admitting: *Deleted

## 2010-08-19 NOTE — Letter (Signed)
Summary: Lexiscan or Dobutamine Pharmacist, community at [facility]  [address]   [place], [place] [address]   Phone: [phone]  Fax: [phone]      [facility]  Lexiscan or Dobutamine Cardiolite Strss Test    [place]  Appointment Date:_  Appointment Time:_  Your doctor has ordered a CARDIOLITE STRESS TEST using a medication to stimulate exercise so that you will not have to walk on the treadmill to determine the condition of your heart during stress. If you take blood pressure medication, ask your doctor if you should take it the day of your test. You should not have anything to eat or drink at least 4 hours before your test is scheduled, and no caffeine, including decaffeinated tea and coffee, chocolate, and soft drinks for 24 hours before your test.  You will need to register at the Outpatient/Main Entrance at the hospital 15 minutes before your appointment time. It is a good idea to bring a copy of your order with you. They will direct you to the Diagnostic Imaging (Radiology) Department.  You will be asked to undress from the waist up and given a hospital gown to wear, so dress comfortably from the waist down for example: Sweat pants, shorts, or skirt Rubber soled lace up shoes (tennis shoes)  Plan on about three hours from registration to release from the hospital

## 2010-08-19 NOTE — Assessment & Plan Note (Signed)
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  The patient has a prior history of right hemothorax requiringVATS and drainage with decortication February 2010.  The patient also has a history of alcohol abuse.  He is a history of COPD and continues to smoke.  The patient also continues to drink.  He appears to have some degree of encephalopathy from chronic alcohol use.  The patient stated he short of breath on minimal exertion.  He also reports some chest tightness.  He really can go very far anymore before it gives.  He is probably chronically swollen right ankle possibly from an injury.  He continues to have multiple risk factors that are uncontrolled including hypertension diabetes mellitus and dyslipidemia.the patient remains on chronic warfarin therapy.  This had no bleeding complications.  Preventive Screening-Counseling & Management  Alcohol-Tobacco     Smoking Status: quit     Year Quit: 2010  Current Medications (verified): 1)  Simvastatin 40 Mg Tabs (Simvastatin) .... Take 1/2 Tab By Mouth Daily 2)  Sertraline Hcl 50 Mg Tabs (Sertraline Hcl) .... Once Daily 3)  Aspirin Adult Low Strength 81 Mg Tbec (Aspirin) .... Once Daily 4)  Lisinopril 40 Mg Tabs (Lisinopril) .Marland Kitchen.. 1 Daily 5)  Atrovent Hfa 17 Mcg/act Aers (Ipratropium Bromide Hfa) .... 2 Puffs Three Times A Day 6)  Coreg 25 Mg Tabs (Carvedilol) .Marland Kitchen.. 1 Twice A Day 7)  Advair Diskus 250-50 Mcg/dose Misc (Fluticasone-Salmeterol) .Marland Kitchen.. 1 Puff 2 Times Daily 8)  Folic Acid 1 Mg Tabs (Folic Acid) .... Take 1 Tablet By Mouth Once A Day 9)  Multivitamins  Tabs (Multiple Vitamin) .... Take 1 Tablet By Mouth Once A Day 10)  Lasix 20 Mg Tabs (Furosemide) .... Take 1  Tablet By Mouth Once A Day 11)  Nifedipine 90 Mg Xr24h-Tab (Nifedipine) .... Take 1 Tablet By Mouth Once A Day 12)  Metformin Hcl 1000 Mg Tabs (Metformin Hcl) .... Take 1 Tablet By Mouth Two Times A Day 13)  Warfarin Sodium 5 Mg Tabs (Warfarin Sodium) .... As Directed By Coumadin Clinic 14)  Bilberry 60 Mg Caps (Bilberry (Vaccinium Myrtillus)) .... Take 2 Tabs By Mouth Daily 15)  Potassium Gluconate 595 Mg Tabs (Potassium Gluconate) .... Take 2 Tablet By Mouth Two Times A Day 16)  Prilosec Otc 20 Mg Tbec (Omeprazole Magnesium) .... Take 1 Tablet By Mouth Once A Day 17)  Procardia Xl 30 Mg Xr24h-Tab (Nifedipine) .... Take 1 Daily With Procardia Xl 90mg  18)  Spironolactone 25 Mg Tabs (Spironolactone) .... Take 1/2 Tablet By Mouth Once A Day 19)  Fish Oil 1000 Mg Caps (Omega-3 Fatty Acids) .... Take 2 Tablet By Mouth Two Times A Day 20)  Potassium Chloride Crys Cr 20 Meq Cr-Tabs (Potassium Chloride Crys Cr) .... Take 1 Tablet By Mouth Once A Day 21)  Glimepiride 4 Mg Tabs (Glimepiride) .... Take 1 Tablet By Mouth Once A Day 22)  Alga-K  Caps (Misc Natural Products) .... Take 1 Tablet By Mouth Two Times A Day  Allergies (verified): 1)  ! Percocet 2)  ! Vicodin 3)  ! Ativan  Comments:  Nurse/Medical Assistant: The patient is currently on medications but does not know the name or dosage at this time. Instructed to contact  our office with details. Will update medication list at that time.  Past History:  Past Medical History: Last updated: 01-11-09 1.  Atrial fibrillation:  This was first noted in 2/10 when pt presented to [facility] with a stroke.  Pt was initially anticoagulated but developed a hemothorax.  He had CHF with the atrial fibrillation so was cardioverted to NSR.   2.  CVA:  left MCA with right-sided symptoms.  Bilateral carotids noted to be totally occluded on carotid dopplers. Stroke happened in the setting of atrial fibrillation.  3.  ETOH abuse:  Pt drinks a 12-pack nightly  and has been doing so for a long time. 4.  COPD:  pt quit smoking in 2/10.  PFTs showed mixed obstructive and restrictive disease.  Restrictive component was not likely to be due to parenchymal lung disease (probably from post-surgical pain and obesity) as DLCO only mildly decreased.  From pulmonary standpoint, would be OK to use amiodarone if needed.  5.  HTN 6.  DM2:  currently managed only with diet.  7.  Hyperlipidemia 8.  Right Hemothorax 2/10 associated with anticoagulation.  S/p VATS and drainage of hemothorax with decortication (Dr. Edwyna Shell).   9.  Diastolic CHF:  Echo (2/10):  Ef 55-60%, mild LVH, mild LAE  Past Surgical History: Last updated: 11/27/2008 Right hemothorax with status post video-assisted thoracoscopic       surgery and drainage of hemothorax, August 18, 2008.  Right thoracotomy with extensive decortication as well.  Family History: Last updated: 01/11/2009 His mother is deceased and had diabetes mellitus.  His father is deceased and had an aneurysm.  He has one sister, who is status post CVA.  There is also a strong family history of diabetes and hypertension. No premature CAD.   Social History: Last updated: 01/11/09 Works in [employer] transport manager.  Lives in [place], [place].   Married with no children Alcohol Use - yes -- 12 pack or more a day for years.  Has now cut back a little to 9-10 daily. Never blacks out.  Drug Use - no Patient states former smoker.  started at age 57.  3 to 4 ppd.  quit 2010.    Risk Factors: Smoking Status: quit (08/10/2010) Packs/Day: 3 to 4 ppd (11/27/2008)  Review of Systems       The patient complains of fatigue, chest pain, shortness of breath, leg swelling, and dizziness.  The patient denies malaise, fever, weight gain/loss, vision loss, decreased hearing, hoarseness, palpitations, prolonged cough, wheezing, sleep apnea, coughing up blood, abdominal pain, blood in stool, nausea, vomiting, diarrhea, heartburn, incontinence, blood in  urine, muscle weakness, joint pain, rash, skin lesions, headache, fainting, depression, anxiety, enlarged lymph nodes, easy bruising or bleeding, and environmental allergies.    Vital Signs:  Patient profile:   55 year old male Height:      75 inches Weight:      292 pounds BMI:     36.63 Pulse rate:   73 / minute BP sitting:   107 / 66  (left arm) Cuff size:   large  Vitals Entered By: Carlye Grippe (August 10, 2010 2:26 PM)  Nutrition Counseling: Patient's BMI is greater than 25 and therefore counseled on weight management options.  Physical Exam  Additional Exam:  General: Well-developed, well-nourished in no distress head: Normocephalic and atraumatic eyes PERRLA/EOMI intact, conjunctiva and lids normal nose: No deformity or lesions mouth normal dentition, normal posterior pharynx neck: Supple, no JVD.  No masses, thyromegaly or abnormal cervical  nodes lungs: Normal breath sounds bilaterally without wheezing.  Normal percussion heart: regular rate and rhythm with normal S1 and S2, no S3 or S4.  PMI is normal.  No pathological murmurs abdomen: Normal bowel sounds, abdomen is soft and nontender without masses, organomegaly or hernias noted.  No hepatosplenomegaly musculoskeletal: Back normal, normal gait muscle strength and tone normal pulsus: Pulse is normal in all 4 extremities Extremities: 1+ peripheral pitting edema neurologic: Alert and oriented x 3 skin: Intact without lesions or rashes cervical nodes: No significant adenopathy psychologic: Normal affect    EKG  Procedure date:  08/10/2010  Findings:      normal sinus rhythm.  Normal EKG.  Heart rate 72 beats/min  Impression & Recommendations:  Problem # 1:  SHORTNESS OF BREATH (ICD-786.05) the patient has significant shortness of breath which appears to have worsened.  This certainly could be secondary to his COPD however he has never had a prior stress test and has known severe peripheral vascular disease  and multiple risk factors.  The patient could have significant coronary artery disease and we will proceed with a Lexi scan. His updated medication list for this problem includes:    Aspirin Adult Low Strength 81 Mg Tbec (Aspirin) ..... Once daily    Lisinopril 40 Mg Tabs (Lisinopril) .Marland Kitchen... 1 daily    Coreg 25 Mg Tabs (Carvedilol) .Marland Kitchen... 1 twice a day    Lasix 20 Mg Tabs (Furosemide) .Marland Kitchen... Take 1 tablet by mouth once a day    Nifedipine 90 Mg Xr24h-tab (Nifedipine) .Marland Kitchen... Take 1 tablet by mouth once a day    Procardia Xl 30 Mg Xr24h-tab (Nifedipine) .Marland Kitchen... Take 1 daily with procardia xl 90mg     Spironolactone 25 Mg Tabs (Spironolactone) .Marland Kitchen... Take 1/2 tablet by mouth once a day  Orders: T-CBC No Diff ([id number]) T-Comprehensive Metabolic Panel ([id number]) T-Lipid Profile ([id number]) T-TSH ([id number]) Nuclear Med (Nuc Med)  Problem # 2:  ATRIAL FIBRILLATION (ICD-427.31) the patient remains in normal sinus rhythm.  His EKG was reviewed.  He reports no palpitations.  His liver function tests have been normal and seems to be able to tolerate his Coumadin without any bleeding complications despite his alcohol use. His updated medication list for this problem includes:    Aspirin Adult Low Strength 81 Mg Tbec (Aspirin) ..... Once daily    Coreg 25 Mg Tabs (Carvedilol) .Marland Kitchen... 1 twice a day    Warfarin Sodium 5 Mg Tabs (Warfarin sodium) .Marland Kitchen... As directed by coumadin clinic  Orders: EKG w/ Interpretation (93000) T-CBC No Diff ([id number]) T-Comprehensive Metabolic Panel ([id number]) T-Lipid Profile (60630-16010) T-TSH ([id number])  Problem # 3:  HYPERLIPIDEMIA-MIXED (ICD-272.4) a lipid panel and liver function tests will be drawn. His updated medication list for this problem includes:    Simvastatin 40 Mg Tabs (Simvastatin) .Marland Kitchen... 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 Orders: T-PSA ([id number])  Problem # 5:  COPD (ICD-496) Assessment: Comment Only  His updated medication list for this problem includes:    Atrovent Hfa 17 Mcg/act Aers (Ipratropium bromide hfa) .Marland Kitchen... 2 puffs three times a day    Advair Diskus 250-50 Mcg/dose Misc (Fluticasone-salmeterol) .Marland Kitchen... 1 puff 2 times daily  Problem # 6:  CVA (ICD-434.91) no residual symptoms.  History of left MCA and bilateral carotid total occlusions.  The stroke happened in the setting  of atrial fibrillation. His updated medication list for this problem includes:    Aspirin Adult Low Strength 81 Mg Tbec (Aspirin) ..... Once daily    Warfarin Sodium 5 Mg Tabs (Warfarin sodium) .Marland Kitchen... As directed by coumadin clinic  Patient Instructions: 1)  Labs:  CBC, CMET, LIPDS, TSH, PSA 2)  Lexiscan stress test  3)  Follow up in  6 months

## 2010-08-22 IMAGING — CR DG CHEST 2V
2 series · 2 of 2 positions shown · non-contrast
Comparison: Chest x-ray of 09/16/2008

CLINICAL DATA: Status post right that is on 08/19/2008, follow-up

CHEST - 2 VIEW

[w chest pa]
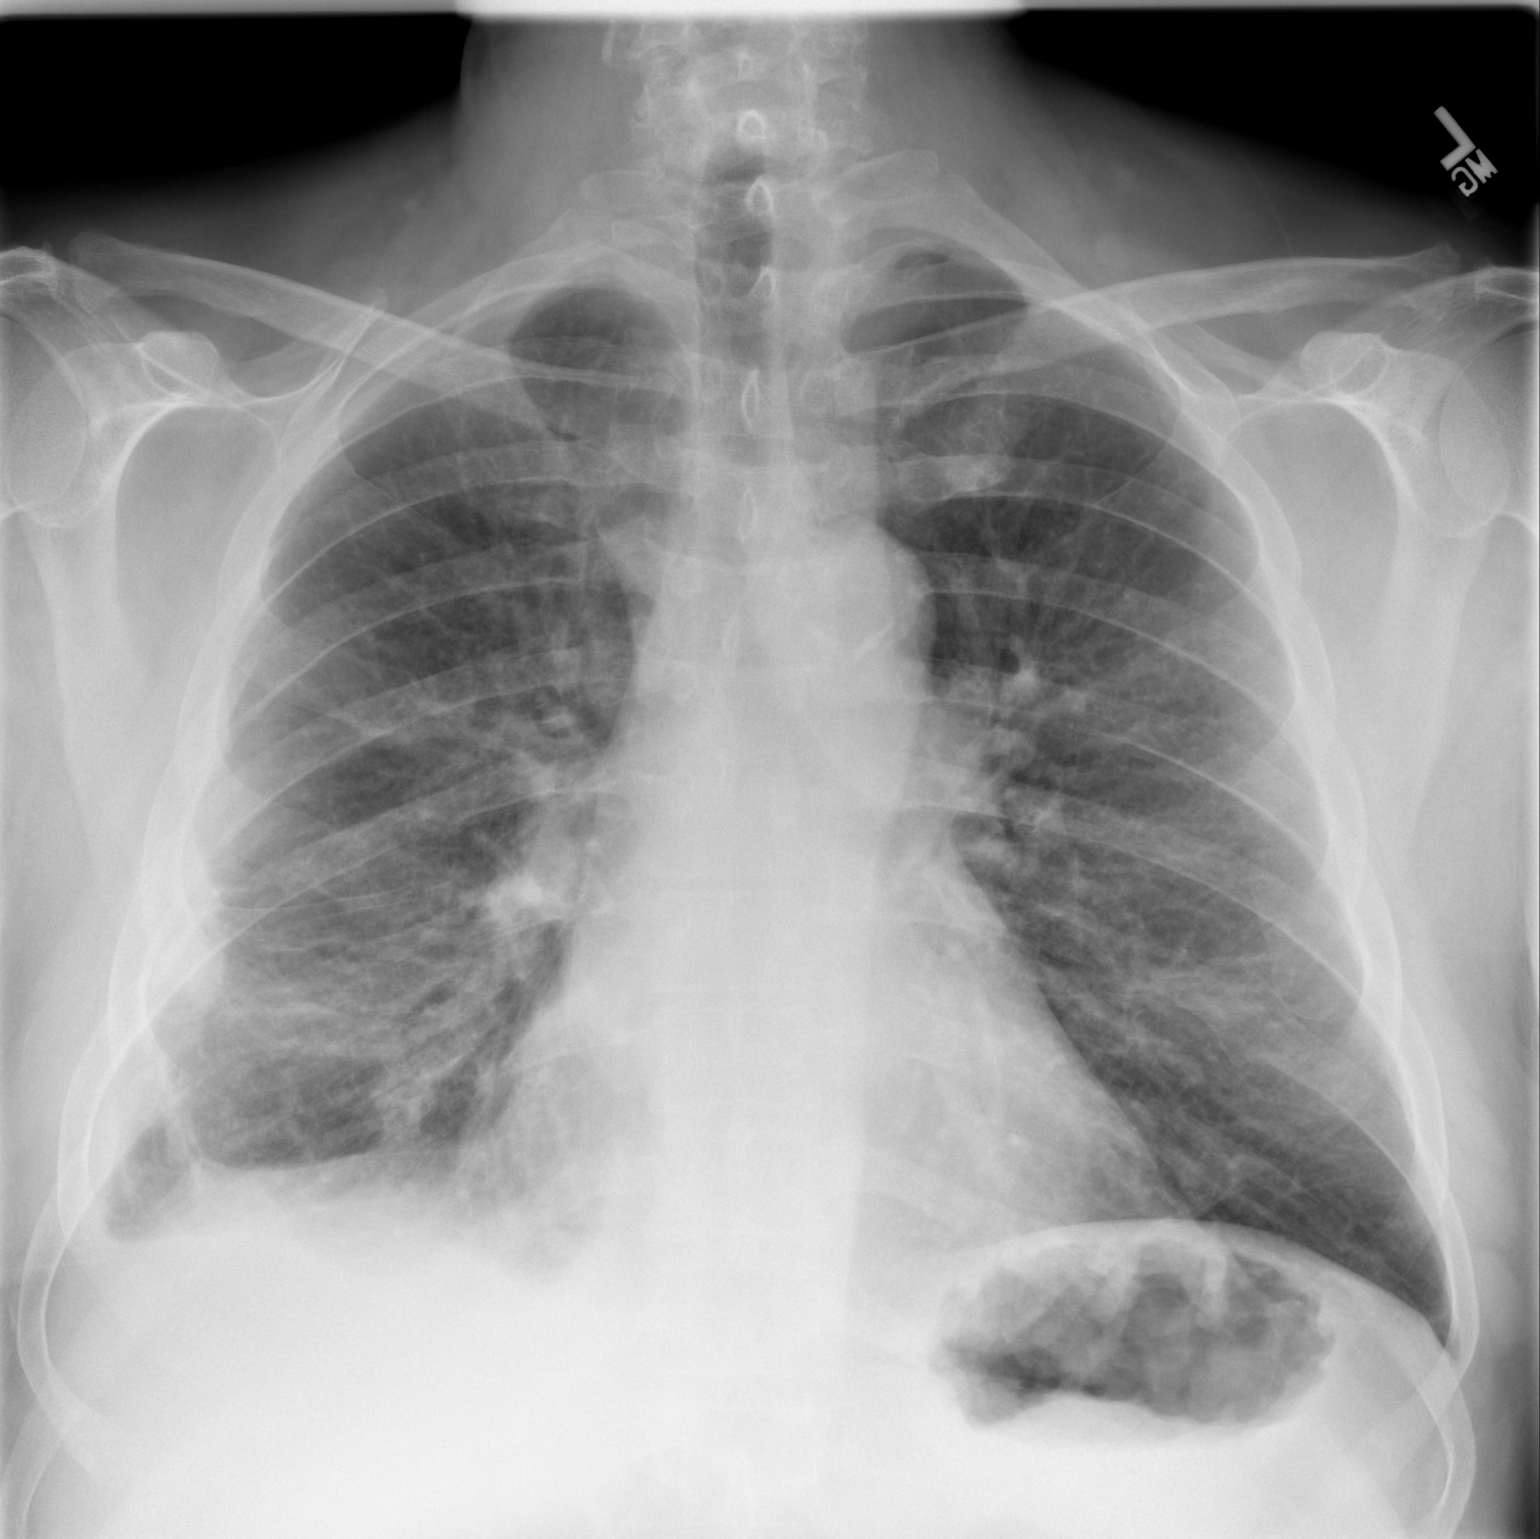

[w chest lat]
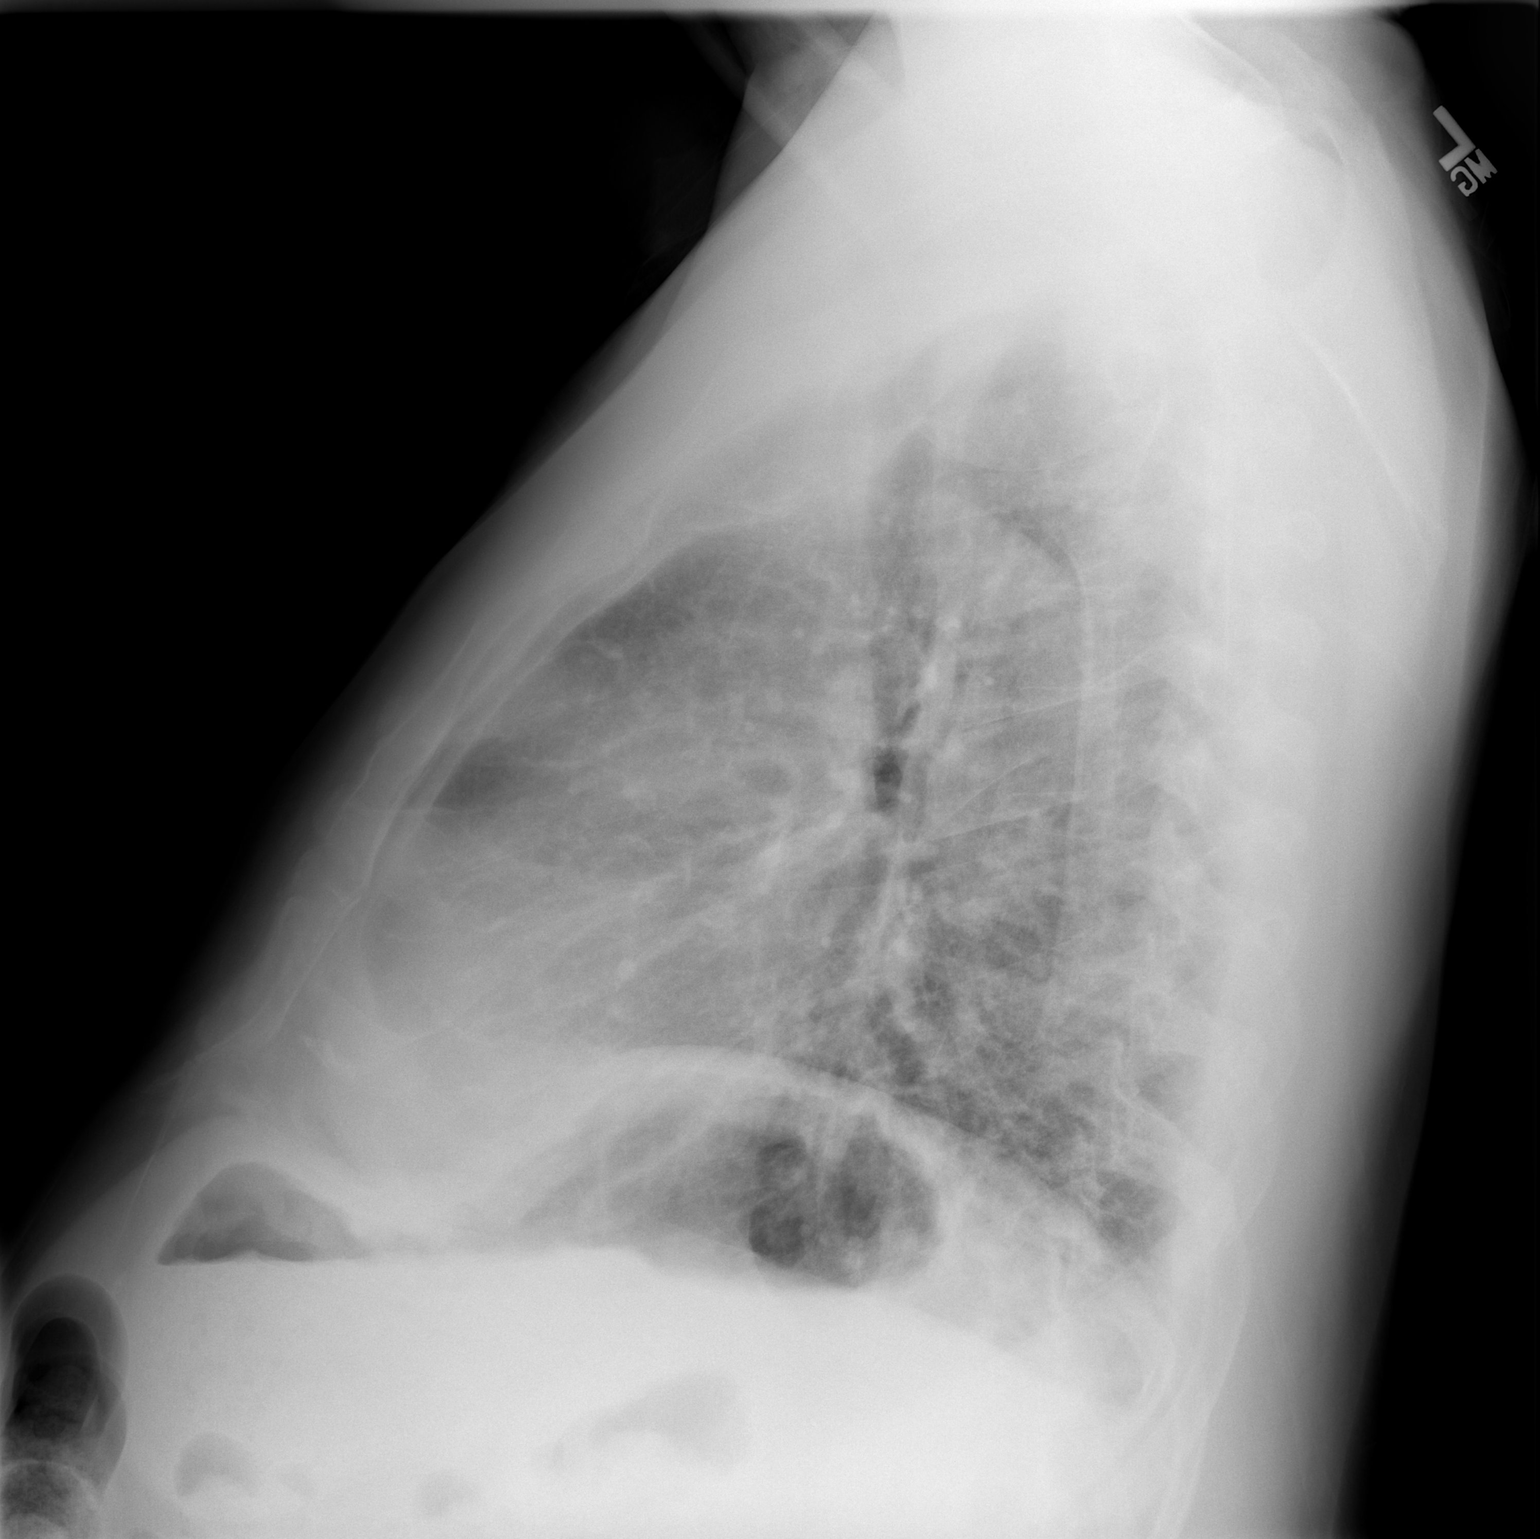

[2 of 2 positions shown; findings below may reference images not displayed]

FINDINGS: Aeration has improved.  There does appear to be mild
pulmonary vascular congestion present.  Right pleural thickening
versus small right loculated effusion remain.  Mild cardiomegaly is
stable.  No acute bony abnormality is seen.
IMPRESSION: Slightly better aeration.  Persistent pleural opacity at the right
lung base may represent pleural thickening or loculated effusion.
Little change in mild pulmonary vascular congestion.

## 2010-08-25 NOTE — Progress Notes (Signed)
Summary: can he wait on stress test   Phone Note Other Incoming Call back at cell:  [phone]   Caller: wife - Page  Summary of Call: Wants to know if okay to wait till latter part of August to do stress test.   Medicare will not start till then.  Current insurance will not cover this test.  Scheduled for 2/14 & 15.   Initial call taken by: Hoover Brunette, LPN,  August 12, 2010 5:32 PM  Follow-up for Phone Call        I reported in mind noted that I would like to precede with Lexiscan as soon as possible. However it the patient is stable and his chest pain is not worsening he could wait but this would not be my preference  Follow-up by: Lewayne Bunting, MD, [facility],  August 15, 2010 4:34 AM  Additional Follow-up for Phone Call Additional follow up Details #1::        Wife (Page) notified of above.  States that they will wait till August when Medicare begins.  Really struggling financially now.  Advised pt to call back if change mind or start to have worsening chest pain.  Stress test cancelled at [facility].  She verbalized understanding.  Additional Follow-up by: Hoover Brunette, LPN,  August 16, 2010 4:51 PM

## 2010-09-01 ENCOUNTER — Encounter: Payer: Self-pay | Admitting: Cardiology

## 2010-09-10 ENCOUNTER — Encounter (INDEPENDENT_AMBULATORY_CARE_PROVIDER_SITE_OTHER): Payer: PRIVATE HEALTH INSURANCE

## 2010-09-10 ENCOUNTER — Encounter: Payer: Self-pay | Admitting: Cardiology

## 2010-09-10 DIAGNOSIS — Z7901 Long term (current) use of anticoagulants: Secondary | ICD-10-CM

## 2010-09-10 DIAGNOSIS — I4891 Unspecified atrial fibrillation: Secondary | ICD-10-CM

## 2010-09-10 LAB — CONVERTED CEMR LAB: POC INR: 2.2

## 2010-09-14 NOTE — Medication Information (Signed)
Summary: ccr-lr  Anticoagulant Therapy  Managed by: Vashti Hey, RN PCP: Meredith Mody, MD Supervising MD: Andee Lineman MD, Michelle Piper Indication 1: CVA--stroke (ICD-436) Indication 2: Atrial Fibrillation (ICD-427.31) Lab [facility] [place] Site: [place] INR POC 2.2  Dietary changes: no    Health status changes: no    Bleeding/hemorrhagic complications: no    Recent/future hospitalizations: no    Any changes in medication regimen? no    Recent/future dental: no  Any missed doses?: no       Is patient compliant with meds? yes       Allergies: 1)  ! Percocet 2)  ! Vicodin 3)  ! Ativan  Anticoagulation Management History:      The patient is taking warfarin and comes in today for a routine follow up visit.  Positive risk factors for bleeding include history of CVA/TIA and presence of serious comorbidities.  Negative risk factors for bleeding include an age less than 25 years old.  The bleeding index is 'intermediate risk'.  Positive CHADS2 values include History of CHF, History of HTN, History of Diabetes, and Prior Stroke/CVA/TIA.  Negative CHADS2 values include Age > 60 years old.  The start date was 10/21/2008.  Anticoagulation responsible provider: Andee Lineman MD, Michelle Piper.  INR POC: 2.2.  Cuvette Lot#: [id number].  Exp: 10/11.    Anticoagulation Management Assessment/Plan:      The patient's current anticoagulation dose is Warfarin sodium 5 mg tabs: as directed by coumadin clinic.  The target INR is 2 - 3.  The next INR is due 10/12/2010.  Anticoagulation instructions were given to patient.  Results were reviewed/authorized by Vashti Hey, RN.  He was notified by Vashti Hey RN.         Prior Anticoagulation Instructions: INR 2.7 Continue coumadin 10mg  once daily except 15mg  on Saturdays  Current Anticoagulation Instructions: INR 2.2 Continue coumadin 10mg  once daily except 15mg  on Saturdays

## 2010-10-11 ENCOUNTER — Encounter: Payer: Self-pay | Admitting: Cardiology

## 2010-10-11 DIAGNOSIS — I6350 Cerebral infarction due to unspecified occlusion or stenosis of unspecified cerebral artery: Secondary | ICD-10-CM

## 2010-10-11 DIAGNOSIS — I4891 Unspecified atrial fibrillation: Secondary | ICD-10-CM

## 2010-10-11 DIAGNOSIS — Z7901 Long term (current) use of anticoagulants: Secondary | ICD-10-CM | POA: Insufficient documentation

## 2010-10-12 ENCOUNTER — Ambulatory Visit (INDEPENDENT_AMBULATORY_CARE_PROVIDER_SITE_OTHER): Payer: PRIVATE HEALTH INSURANCE | Admitting: *Deleted

## 2010-10-12 DIAGNOSIS — I4891 Unspecified atrial fibrillation: Secondary | ICD-10-CM

## 2010-10-12 DIAGNOSIS — Z7901 Long term (current) use of anticoagulants: Secondary | ICD-10-CM

## 2010-10-12 DIAGNOSIS — I6350 Cerebral infarction due to unspecified occlusion or stenosis of unspecified cerebral artery: Secondary | ICD-10-CM

## 2010-10-12 LAB — POCT INR: INR: 2.6

## 2010-10-14 ENCOUNTER — Other Ambulatory Visit: Payer: Self-pay | Admitting: Cardiology

## 2010-10-14 LAB — BASIC METABOLIC PANEL
BUN: 8 mg/dL (ref 6–23)
BUN: 9 mg/dL (ref 6–23)
Calcium: 9.2 mg/dL (ref 8.4–10.5)
Chloride: 101 mEq/L (ref 96–112)
Creatinine, Ser: 0.66 mg/dL (ref 0.4–1.5)
GFR calc Af Amer: >60 mL/min (ref >60)
GFR calc non Af Amer: >60 mL/min (ref >60)
GFR calc non Af Amer: >60 mL/min (ref >60)
Potassium: 4.3 mEq/L (ref 3.5–5.1)
Sodium: 138 mEq/L (ref 135–145)

## 2010-10-14 LAB — CBC
HCT: 29.4 % — ABNORMAL LOW (ref 39.0–52.0)
HCT: 32.5 % — ABNORMAL LOW (ref 39.0–52.0)
Hemoglobin: 10.9 g/dL — ABNORMAL LOW (ref 13.0–17.0)
Hemoglobin: 9.7 g/dL — ABNORMAL LOW (ref 13.0–17.0)
MCV: 95.9 fL (ref 78.0–100.0)
MCV: 96.4 fL (ref 78.0–100.0)
MCV: 97.5 fL (ref 78.0–100.0)
Platelets: 251 10*3/uL (ref 150–400)
Platelets: 328 10*3/uL (ref 150–400)
Platelets: 337 10*3/uL (ref 150–400)
RBC: 3.01 MIL/uL — ABNORMAL LOW (ref 4.22–5.81)
RBC: 3.39 MIL/uL — ABNORMAL LOW (ref 4.22–5.81)
WBC: 6.5 10*3/uL (ref 4.0–10.5)
WBC: 8.1 10*3/uL (ref 4.0–10.5)
WBC: 8.1 10*3/uL (ref 4.0–10.5)

## 2010-10-14 LAB — DIFFERENTIAL
Basophils Absolute: 0 10*3/uL (ref 0.0–0.1)
Basophils Relative: 1 % (ref 0–1)
Lymphocytes Relative: 12 % (ref 12–46)
Monocytes Absolute: 0.7 10*3/uL (ref 0.1–1.0)
Neutro Abs: 6.2 10*3/uL (ref 1.7–7.7)

## 2010-10-14 LAB — GLUCOSE, CAPILLARY
Glucose-Capillary: 105 mg/dL — ABNORMAL HIGH (ref 70–99)
Glucose-Capillary: 110 mg/dL — ABNORMAL HIGH (ref 70–99)
Glucose-Capillary: 113 mg/dL — ABNORMAL HIGH (ref 70–99)
Glucose-Capillary: 115 mg/dL — ABNORMAL HIGH (ref 70–99)
Glucose-Capillary: 116 mg/dL — ABNORMAL HIGH (ref 70–99)
Glucose-Capillary: 116 mg/dL — ABNORMAL HIGH (ref 70–99)
Glucose-Capillary: 117 mg/dL — ABNORMAL HIGH (ref 70–99)
Glucose-Capillary: 139 mg/dL — ABNORMAL HIGH (ref 70–99)
Glucose-Capillary: 143 mg/dL — ABNORMAL HIGH (ref 70–99)
Glucose-Capillary: 147 mg/dL — ABNORMAL HIGH (ref 70–99)
Glucose-Capillary: 149 mg/dL — ABNORMAL HIGH (ref 70–99)
Glucose-Capillary: 173 mg/dL — ABNORMAL HIGH (ref 70–99)
Glucose-Capillary: 91 mg/dL (ref 70–99)
Glucose-Capillary: 97 mg/dL (ref 70–99)

## 2010-10-14 LAB — RETICULOCYTES
Retic Count, Absolute: 218 10*3/uL — ABNORMAL HIGH (ref 19.0–186.0)
Retic Ct Pct: 6.9 % — ABNORMAL HIGH (ref 0.4–3.1)

## 2010-10-14 LAB — COMPREHENSIVE METABOLIC PANEL
Albumin: 2.6 g/dL — ABNORMAL LOW (ref 3.5–5.2)
Alkaline Phosphatase: 269 U/L — ABNORMAL HIGH (ref 39–117)
BUN: 9 mg/dL (ref 6–23)
CO2: 32 mEq/L (ref 19–32)
Chloride: 97 mEq/L (ref 96–112)
Creatinine, Ser: 0.63 mg/dL (ref 0.4–1.5)
GFR calc non Af Amer: >60 mL/min (ref >60)
Glucose, Bld: 113 mg/dL — ABNORMAL HIGH (ref 70–99)
Potassium: 4.6 mEq/L (ref 3.5–5.1)
Total Bilirubin: 0.7 mg/dL (ref 0.3–1.2)

## 2010-10-19 LAB — CBC
HCT: 29.8 % — ABNORMAL LOW (ref 39.0–52.0)
HCT: 24.6 % — ABNORMAL LOW (ref 39.0–52.0)
HCT: 24.9 % — ABNORMAL LOW (ref 39.0–52.0)
HCT: 25.4 % — ABNORMAL LOW (ref 39.0–52.0)
HCT: 25.9 % — ABNORMAL LOW (ref 39.0–52.0)
HCT: 25.9 % — ABNORMAL LOW (ref 39.0–52.0)
HCT: 26.4 % — ABNORMAL LOW (ref 39.0–52.0)
HCT: 26.6 % — ABNORMAL LOW (ref 39.0–52.0)
Hemoglobin: 8.6 g/dL — ABNORMAL LOW (ref 13.0–17.0)
Hemoglobin: 9 g/dL — ABNORMAL LOW (ref 13.0–17.0)
Hemoglobin: 9.3 g/dL — ABNORMAL LOW (ref 13.0–17.0)
MCHC: 33.7 g/dL (ref 30.0–36.0)
MCHC: 34 g/dL (ref 30.0–36.0)
MCHC: 34.9 g/dL (ref 30.0–36.0)
MCV: 95.3 fL (ref 78.0–100.0)
MCV: 95.8 fL (ref 78.0–100.0)
MCV: 97.1 fL (ref 78.0–100.0)
MCV: 97.8 fL (ref 78.0–100.0)
Platelets: 297 10*3/uL (ref 150–400)
Platelets: 273 10*3/uL (ref 150–400)
Platelets: 302 10*3/uL (ref 150–400)
Platelets: 307 10*3/uL (ref 150–400)
Platelets: 333 10*3/uL (ref 150–400)
RBC: 2.57 MIL/uL — ABNORMAL LOW (ref 4.22–5.81)
RBC: 2.61 MIL/uL — ABNORMAL LOW (ref 4.22–5.81)
RBC: 2.73 MIL/uL — ABNORMAL LOW (ref 4.22–5.81)
RBC: 2.94 MIL/uL — ABNORMAL LOW (ref 4.22–5.81)
RDW: 15.2 % (ref 11.5–15.5)
RDW: 15.5 % (ref 11.5–15.5)
RDW: 16.1 % — ABNORMAL HIGH (ref 11.5–15.5)
WBC: 10.6 10*3/uL — ABNORMAL HIGH (ref 4.0–10.5)
WBC: 11.8 10*3/uL — ABNORMAL HIGH (ref 4.0–10.5)
WBC: 14.2 10*3/uL — ABNORMAL HIGH (ref 4.0–10.5)
WBC: 6.2 10*3/uL (ref 4.0–10.5)
WBC: 7.5 10*3/uL (ref 4.0–10.5)
WBC: 7.6 10*3/uL (ref 4.0–10.5)

## 2010-10-19 LAB — FOLATE
Folate: 8.1 ng/mL
Folate: 17 ng/mL

## 2010-10-19 LAB — PREPARE FRESH FROZEN PLASMA

## 2010-10-19 LAB — BASIC METABOLIC PANEL
BUN: 9 mg/dL (ref 6–23)
BUN: 11 mg/dL (ref 6–23)
BUN: 12 mg/dL (ref 6–23)
BUN: 13 mg/dL (ref 6–23)
BUN: 13 mg/dL (ref 6–23)
BUN: 7 mg/dL (ref 6–23)
BUN: 8 mg/dL (ref 6–23)
CO2: 27 mEq/L (ref 19–32)
CO2: 29 mEq/L (ref 19–32)
CO2: 30 mEq/L (ref 19–32)
CO2: 30 mEq/L (ref 19–32)
CO2: 31 mEq/L (ref 19–32)
Calcium: 7.7 mg/dL — ABNORMAL LOW (ref 8.4–10.5)
Calcium: 8.1 mg/dL — ABNORMAL LOW (ref 8.4–10.5)
Calcium: 8.1 mg/dL — ABNORMAL LOW (ref 8.4–10.5)
Calcium: 8.2 mg/dL — ABNORMAL LOW (ref 8.4–10.5)
Calcium: 8.5 mg/dL (ref 8.4–10.5)
Chloride: 97 mEq/L (ref 96–112)
Chloride: 98 mEq/L (ref 96–112)
Chloride: 99 mEq/L (ref 96–112)
Chloride: 99 mEq/L (ref 96–112)
Creatinine, Ser: 0.57 mg/dL (ref 0.4–1.5)
Creatinine, Ser: 0.58 mg/dL (ref 0.4–1.5)
Creatinine, Ser: 0.63 mg/dL (ref 0.4–1.5)
Creatinine, Ser: 0.66 mg/dL (ref 0.4–1.5)
Creatinine, Ser: 0.66 mg/dL (ref 0.4–1.5)
Creatinine, Ser: 0.71 mg/dL (ref 0.4–1.5)
GFR calc Af Amer: >60 mL/min (ref >60)
GFR calc Af Amer: >60 mL/min (ref >60)
GFR calc Af Amer: >60 mL/min (ref >60)
GFR calc non Af Amer: >60 mL/min (ref >60)
GFR calc non Af Amer: >60 mL/min (ref >60)
GFR calc non Af Amer: >60 mL/min (ref >60)
GFR calc non Af Amer: >60 mL/min (ref >60)
GFR calc non Af Amer: >60 mL/min (ref >60)
GFR calc non Af Amer: >60 mL/min (ref >60)
GFR calc non Af Amer: >60 mL/min (ref >60)
Glucose, Bld: 111 mg/dL — ABNORMAL HIGH (ref 70–99)
Glucose, Bld: 108 mg/dL — ABNORMAL HIGH (ref 70–99)
Glucose, Bld: 111 mg/dL — ABNORMAL HIGH (ref 70–99)
Glucose, Bld: 120 mg/dL — ABNORMAL HIGH (ref 70–99)
Glucose, Bld: 127 mg/dL — ABNORMAL HIGH (ref 70–99)
Glucose, Bld: 143 mg/dL — ABNORMAL HIGH (ref 70–99)
Potassium: 4.3 mEq/L (ref 3.5–5.1)
Potassium: 3.6 mEq/L (ref 3.5–5.1)
Potassium: 3.8 mEq/L (ref 3.5–5.1)
Potassium: 4 mEq/L (ref 3.5–5.1)
Potassium: 4.1 mEq/L (ref 3.5–5.1)
Potassium: 4.1 mEq/L (ref 3.5–5.1)
Potassium: 4.3 mEq/L (ref 3.5–5.1)
Sodium: 134 mEq/L — ABNORMAL LOW (ref 135–145)
Sodium: 135 mEq/L (ref 135–145)
Sodium: 137 mEq/L (ref 135–145)

## 2010-10-19 LAB — GLUCOSE, CAPILLARY
Glucose-Capillary: 114 mg/dL — ABNORMAL HIGH (ref 70–99)
Glucose-Capillary: 127 mg/dL — ABNORMAL HIGH (ref 70–99)
Glucose-Capillary: 100 mg/dL — ABNORMAL HIGH (ref 70–99)
Glucose-Capillary: 101 mg/dL — ABNORMAL HIGH (ref 70–99)
Glucose-Capillary: 105 mg/dL — ABNORMAL HIGH (ref 70–99)
Glucose-Capillary: 110 mg/dL — ABNORMAL HIGH (ref 70–99)
Glucose-Capillary: 112 mg/dL — ABNORMAL HIGH (ref 70–99)
Glucose-Capillary: 115 mg/dL — ABNORMAL HIGH (ref 70–99)
Glucose-Capillary: 121 mg/dL — ABNORMAL HIGH (ref 70–99)
Glucose-Capillary: 121 mg/dL — ABNORMAL HIGH (ref 70–99)
Glucose-Capillary: 123 mg/dL — ABNORMAL HIGH (ref 70–99)
Glucose-Capillary: 124 mg/dL — ABNORMAL HIGH (ref 70–99)
Glucose-Capillary: 124 mg/dL — ABNORMAL HIGH (ref 70–99)
Glucose-Capillary: 124 mg/dL — ABNORMAL HIGH (ref 70–99)
Glucose-Capillary: 128 mg/dL — ABNORMAL HIGH (ref 70–99)
Glucose-Capillary: 132 mg/dL — ABNORMAL HIGH (ref 70–99)
Glucose-Capillary: 136 mg/dL — ABNORMAL HIGH (ref 70–99)
Glucose-Capillary: 138 mg/dL — ABNORMAL HIGH (ref 70–99)
Glucose-Capillary: 143 mg/dL — ABNORMAL HIGH (ref 70–99)
Glucose-Capillary: 145 mg/dL — ABNORMAL HIGH (ref 70–99)
Glucose-Capillary: 146 mg/dL — ABNORMAL HIGH (ref 70–99)
Glucose-Capillary: 147 mg/dL — ABNORMAL HIGH (ref 70–99)
Glucose-Capillary: 148 mg/dL — ABNORMAL HIGH (ref 70–99)
Glucose-Capillary: 149 mg/dL — ABNORMAL HIGH (ref 70–99)
Glucose-Capillary: 155 mg/dL — ABNORMAL HIGH (ref 70–99)
Glucose-Capillary: 157 mg/dL — ABNORMAL HIGH (ref 70–99)
Glucose-Capillary: 162 mg/dL — ABNORMAL HIGH (ref 70–99)
Glucose-Capillary: 173 mg/dL — ABNORMAL HIGH (ref 70–99)
Glucose-Capillary: 90 mg/dL (ref 70–99)
Glucose-Capillary: 98 mg/dL (ref 70–99)
Glucose-Capillary: 99 mg/dL (ref 70–99)

## 2010-10-19 LAB — BLOOD GAS, ARTERIAL
Acid-Base Excess: 4.8 mmol/L — ABNORMAL HIGH (ref 0.0–2.0)
Bicarbonate: 28.4 mEq/L — ABNORMAL HIGH (ref 20.0–24.0)
O2 Saturation: 92.5 %
Patient temperature: 100.3
TCO2: 29.6 mmol/L (ref 0–100)
pH, Arterial: 7.458 — ABNORMAL HIGH (ref 7.350–7.450)

## 2010-10-19 LAB — POCT I-STAT 3, ART BLOOD GAS (G3+)
Acid-Base Excess: 4 mmol/L — ABNORMAL HIGH (ref 0.0–2.0)
Bicarbonate: 29.7 mEq/L — ABNORMAL HIGH (ref 20.0–24.0)
Bicarbonate: 30.3 mEq/L — ABNORMAL HIGH (ref 20.0–24.0)
O2 Saturation: 93 %
O2 Saturation: 95 %
Patient temperature: 98.9
TCO2: 30 mmol/L (ref 0–100)
pCO2 arterial: 43.5 mmHg (ref 35.0–45.0)
pCO2 arterial: 45.5 mmHg — ABNORMAL HIGH (ref 35.0–45.0)
pH, Arterial: 7.385 (ref 7.350–7.450)
pO2, Arterial: 64 mmHg — ABNORMAL LOW (ref 80.0–100.0)
pO2, Arterial: 72 mmHg — ABNORMAL LOW (ref 80.0–100.0)

## 2010-10-19 LAB — RETICULOCYTES
RBC.: 2.7 MIL/uL — ABNORMAL LOW (ref 4.22–5.81)
Retic Ct Pct: 3.6 % — ABNORMAL HIGH (ref 0.4–3.1)

## 2010-10-19 LAB — AFB CULTURE WITH SMEAR (NOT AT ARMC): Acid Fast Smear: NONE SEEN

## 2010-10-19 LAB — BODY FLUID CULTURE

## 2010-10-19 LAB — URINE CULTURE
Colony Count: NO GROWTH
Colony Count: NO GROWTH
Special Requests: NEGATIVE

## 2010-10-19 LAB — CROSSMATCH

## 2010-10-19 LAB — NA AND K (SODIUM & POTASSIUM), RAND UR
Potassium Urine: 83 mEq/L
Sodium, Ur: 68 mEq/L

## 2010-10-19 LAB — BRAIN NATRIURETIC PEPTIDE: Pro B Natriuretic peptide (BNP): 251 pg/mL — ABNORMAL HIGH (ref 0.0–100.0)

## 2010-10-19 LAB — COMPREHENSIVE METABOLIC PANEL
ALT: 22 U/L (ref 0–53)
Alkaline Phosphatase: 43 U/L (ref 39–117)
CO2: 28 mEq/L (ref 19–32)
Chloride: 96 mEq/L (ref 96–112)
GFR calc non Af Amer: >60 mL/min (ref >60)
Glucose, Bld: 115 mg/dL — ABNORMAL HIGH (ref 70–99)
Potassium: 3.4 mEq/L — ABNORMAL LOW (ref 3.5–5.1)
Sodium: 133 mEq/L — ABNORMAL LOW (ref 135–145)
Total Bilirubin: 0.6 mg/dL (ref 0.3–1.2)

## 2010-10-19 LAB — TISSUE CULTURE

## 2010-10-19 LAB — APTT: aPTT: 38 seconds — ABNORMAL HIGH (ref 24–37)

## 2010-10-19 LAB — MAGNESIUM
Magnesium: 1.7 mg/dL (ref 1.5–2.5)
Magnesium: 1.9 mg/dL (ref 1.5–2.5)

## 2010-10-19 LAB — IRON AND TIBC: UIBC: 107 ug/dL

## 2010-10-19 LAB — PROTIME-INR: INR: 1.4 (ref 0.00–1.49)

## 2010-10-19 LAB — FUNGUS CULTURE W SMEAR: Fungal Smear: NONE SEEN

## 2010-10-19 LAB — GLUCOSE, RANDOM: Glucose, Bld: 126 mg/dL — ABNORMAL HIGH (ref 70–99)

## 2010-10-19 LAB — VITAMIN B12
Vitamin B-12: 1146 pg/mL — ABNORMAL HIGH (ref 211–911)
Vitamin B-12: 1058 pg/mL — ABNORMAL HIGH (ref 211–911)

## 2010-10-19 LAB — TSH: TSH: 5.573 u[IU]/mL — ABNORMAL HIGH (ref 0.350–4.500)

## 2010-10-19 LAB — HEMOGLOBIN A1C: Hgb A1c MFr Bld: 5.9 % (ref 4.6–6.1)

## 2010-10-19 LAB — FERRITIN: Ferritin: 958 ng/mL — ABNORMAL HIGH (ref 22–322)

## 2010-10-19 LAB — VANCOMYCIN, TROUGH: Vancomycin Tr: 13.1 ug/mL (ref 10.0–20.0)

## 2010-10-19 LAB — LIPID PANEL
Total CHOL/HDL Ratio: 5.4 RATIO
VLDL: 16 mg/dL (ref 0–40)

## 2010-10-19 LAB — OSMOLALITY: Osmolality: 280 mOsm/kg (ref 275–300)

## 2010-11-09 ENCOUNTER — Ambulatory Visit (INDEPENDENT_AMBULATORY_CARE_PROVIDER_SITE_OTHER): Payer: PRIVATE HEALTH INSURANCE | Admitting: *Deleted

## 2010-11-09 DIAGNOSIS — I4891 Unspecified atrial fibrillation: Secondary | ICD-10-CM

## 2010-11-09 DIAGNOSIS — Z7901 Long term (current) use of anticoagulants: Secondary | ICD-10-CM

## 2010-11-09 DIAGNOSIS — I6350 Cerebral infarction due to unspecified occlusion or stenosis of unspecified cerebral artery: Secondary | ICD-10-CM

## 2010-11-09 LAB — POCT INR: INR: 2.5

## 2010-11-16 NOTE — Letter (Signed)
October 15, 2008   Marca Ancona, MD  [address]  [place] [address]   Re:  [name], [name]               DOB:  [birth year]   Dear Dr. Shirlee Latch:   I saw the patient back in the office today.  His blood pressure is  150/70, pulse 68, respirations 18, and saturations were 98%.  His chest  x-ray after his thoracotomy looks great and did show some postoperative  changes.  He has also improved in his voice and speech, and walking.  He  has made a good recovery from his stroke.  I understand that he wants to  be started on Coumadin.  Given his problems with Coumadin in the past, I  think we have to be very careful on this and trying to keep his INR not  greater than 2.5.  Right now, he is just on Plavix.  I will see him back  again in 2 months with a chest x-ray.   Ines Bloomer, M.D.  Electronically Signed   DPB/MEDQ  D:  10/15/2008  T:  10/16/2008  Job:  [id number]   cc:   Dr. Willaim Bane

## 2010-11-16 NOTE — Consult Note (Signed)
[name], [name]             ACCOUNT NO.:  [id number]   MEDICAL RECORD NO.:  [id number]          PATIENT TYPE:  INP   LOCATION:  2014                         FACILITY:  [facility]   PHYSICIAN:  Oretha Milch, MD      DATE OF BIRTH:  [birth year]   DATE OF CONSULTATION:  08/18/2008  DATE OF DISCHARGE:                                 CONSULTATION   REQUESTING PHYSICIAN:  Ines Bloomer, MD   REASON FOR CONSULTATION:  Preop evaluation prior to VATS drainage of  right hemothorax.   PRIMARY CARE PHYSICIAN:  Meredith Mody, MD, [place], [place].   PULMONOLOGIST:  Dr. Orson Aloe.   HISTORY OF PRESENT ILLNESS:  [name] is a 54 year old gentleman with  a prior history of type 2 diabetes, hypertension, alcohol, and tobacco  abuse.  He developed a cough productive of yellow phlegm in the last  week of January.  He was initially treated by his primary care physician  with Percocet, ibuprofen, and prednisone.  He subsequently received Z-  PAK and Cipro after an x-ray on July 31, 2008, showed a suspicion of  bilateral lower lobe pneumonia.  He apparently had a fall where he  injured his abdomen and there was no reported head injury.  He developed  confusion while on these medications, which was transient.  About 10  minutes after taking his first dose of Cipro, he developed more  confusion, dysarthria, and a right-sided facial droop.  He was then  taken to the emergency room at [facility]  Evaluation there showed a  chest x-ray suggestive of pulmonary edema with cardiomegaly and small  right-sided effusion, and a CT angiogram confirmed the left lower lobe  infiltrate but did not show any evidence of pulmonary embolism.  EKG  showed atrial fibrillation and his white cell count was 15.5K.  Arterial  blood gas on room air was 7.50/32/105.  Subsequent neuro imaging showed  a left-sided CVA.  He was admitted with a diagnosis of presumptive  stroke, new-onset atrial fibrillation, and  pneumonia.   COURSE:  He was placed on anticoagulation with Lovenox, Coumadin.  An  echo showed normal LV function.  Carotid duplex showed bilateral  stenoses.  Subsequent neuro imaging including followup head CT and MRI  showed a left CVA.  He developed worsening effusions, which were tapped.  Results of thoracentesis are not available to me, but subsequently he  did well right-sided chest tube was placed.  He also underwent a  bronchoscopy by Dr. Orson Aloe, which only showed minimal purulent  secretions in the left side.  Apparently, chest tube was discontinued  but his right side effusion worsened and a followup CT chest did show a  right loculated hydropneumothorax hence he is being transferred under  Dr. Scheryl Darter service for VATS drainage of this presumed hemothorax.   PAST MEDICAL HISTORY:  1. Type 2 diabetes.  2. Hypertension.  3. COPD.  4. Tobacco and alcohol use.   MEDICATIONS:  Prior to admission at [facility] included:  1. Symbicort 160/4.5 mg 1 puff b.i.d.  2. Spiriva 1 HandiHaler daily.  3. Z-PAK.  4. Ciprofloxacin  500 mg b.i.d.  5. Prilosec 20 mg daily.  6. Aspirin 81 mg daily.  7. Effexor 75 mg b.i.d.  8. Metformin 1000 mg b.i.d.  9. Azor 10/20 mg 1 tablet daily.  10.Clonidine 0.2 mg daily.  11.Motrin 400 mg 2 tablets 3 times a day p.r.n.  12.Prednisone taper starting at 60 mg.  13.Percocet 5/325 mg 2 times a day.   ALLERGIES:  None known.  Apparently with ATIVAN, he had increased  sedation and the onset of his dysarthria was 10 minutes after taking the  CIPRO, I doubt that this amounts to an allergy to CIPROFLOXACIN.   SOCIAL HISTORY:  He works as a IT sales professional for [employer], he  has his own business.  He smokes 2 to 2-1/2 packs per day as per his  wife.  He lives in [place], [place].  He drinks fifteen 12-ounce cans  of beers every day.   FAMILY HISTORY:  Father is deceased, etiology unknown.  Mother died of  complications of diabetes.    REVIEW OF SYSTEMS:  He currently denies pain.  He is able to answer  questions and follow simple commands.  Denies constipation, nausea,  vomiting, or difficulty breathing.   PHYSICAL EXAMINATION:  GENERAL:  Obese adult gentleman lying in bed in  no acute respiratory distress.  VITAL SIGNS:  Afebrile, heart rate 88 per minute, respirations 22 per  minute, oxygen saturation 96% on 2 L nasal cannula.  HEENT:  Mallampati class III airway, narrow pharyngeal space.  NECK:  Supple.  No JVD, no lymphadenopathy.  CVS:  S1 and S2 normal.  CHEST:  Decreased breath sounds on the right.  ABDOMEN:  Soft, distended, nontender.  EXTREMITIES:  A 1+ edema.  NEUROLOGIC:  Right-sided hemiplegia, power 0-1/5; left-sided power 5/5.  Right upper motor neuron facial palsy.  Pupils symmetric and reactive to  light.   LABORATORY DATA:  Unavailable at present.   IMAGING STUDIES:  As described above.   IMPRESSION:  1. Right hemothorax, likely related to anticoagulation.  2. Chronic obstructive pulmonary disease.  3. Likely obstructive sleep apnea based on symptoms and airway exam.  4. 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  5. Diabetes and hypertension.  6. New-onset atrial fibrillation.   RECOMMENDATIONS:  1. Clearly, he is at moderate to high risk for postop pulmonary      complications including pneumonia and prolonged mechanical      ventilation due to his recent stroke.  His lung function is      probably poor as it is indicated by his prehospital admission      medications including Advair and Spiriva.  I doubt that there is      anything else we can due to optimize his lung function.  Note that      he was smoking about 2-3 packs per day until the day of hospital      admission.  2. We would use Xopenex nebs if he remains intubated for longer      duration and resume Advair and Spiriva once he is extubated.  3. I would try to avoid benzos and use  short-acting propofol for      sedation, in case he is not extubated immediately, postoperative.  4. Reduce subcu heparin for DVT prophylaxis.  If this is indeed a      hemothorax, we would avoid anticoagulation in the near term.  5. Given the likely obstructive sleep apnea, he should likely  be      extubated to a BiPAP and we should use nocturnal CPAP.  6. He may need a cardiac evaluation if his 12-lead EKG still shows      atrial fibrillation.  7. Stress dose hydrocortisone can be used peri-operatively since he      was recently on prednisone.   Thank you Dr. Edwyna Shell for involving me in the care of this patient.  We  will assist with his management perioperatively.      Oretha Milch, MD  Electronically Signed     RVA/MEDQ  D:  08/18/2008  T:  08/19/2008  Job:  [id number]   cc:   Ines Bloomer, M.D.

## 2010-11-16 NOTE — Discharge Summary (Signed)
[name], [name]             ACCOUNT NO.:  [id number]   MEDICAL RECORD NO.:  [id number]          PATIENT TYPE:  INP   LOCATION:  2029                         FACILITY:  [facility]   PHYSICIAN:  Ines Bloomer, M.D. DATE OF BIRTH:  Jul 07, 1955   DATE OF ADMISSION:  08/18/2008  DATE OF DISCHARGE:  09/01/2008                               DISCHARGE SUMMARY   ADMITTING DIAGNOSES:  1. Right hemothorax.  2. Status post cerebrovascular accident.  3. Newly diagnosed atrial fibrillation.  4. History of chronic obstructive pulmonary disease.  5. History of hypertension.  6. History of diabetes mellitus.  7. History of gastroesophageal reflux disease.  8. History of anxiety/depression.  9. History of alcohol and tobacco abuse.   DISCHARGE DIAGNOSES:  1. Right hemothorax.  2. Status post cerebrovascular accident.  3. Newly diagnosed atrial fibrillation.  4. History of chronic obstructive pulmonary disease.  5. History of hypertension.  6. History of diabetes mellitus.  7. History of gastroesophageal reflux disease.  8. History of anxiety/depression.  9. History of alcohol and tobacco abuse.   PROCEDURES:  1. Right VATS, right thoracotomy, drainage of right hemothorax with      decortication by Dr. Edwyna Shell on August 19, 2008.  2. Cardioversion on August 22, 2008, by Dr. Shirlee Latch.   PATHOLOGY RESULTS:  Right upper lobe wedge resection, pleural fibrosis,  mild emphysematous changes to right pleural field, chronic inflammation,  fibrin exudates, and abundant blood.   HISTORY OF PRESENTING ILLNESS:  This is a 55 year old Caucasian male who  was transferred from [facility] for further thoracic evaluation  by Dr. Edwyna Shell regarding a right hemothorax.  According to medical  records approximately 2 weeks prior to his admission to [facility], he developed a cough with some purulence sputum as well as  some chest discomfort.  He was initially seen by his primary care  physician who treated him with a Z-Pak for presumed bilateral lower lobe  pneumonia.  He was also given ibuprofen, prednisone, and Percocet for  chest discomfort (secondary to possible fracture grip).  The patient  became dizzy and confused after taking the Percocet.  He went to lie  down on his bed when he began coughing profusely.  At that point, he sat  up on the side of the bed and after became dizzy had a syncopal episode.  He sustained a fall at the side of the bed down to his right side.  Because of aforementioned symptoms, they are not significantly improved.  He returned his primary care physician's office where he was given  another antibiotic, Cipro.  After taking one dose of Cipro, he  apparently became confused, had some slurred speech and subsequently  developed right-sided weakness.  His wife then took him to the emergency  room at [facility] and he was admitted there on August 02, 2008,  for treatment of acute CVA.  CT of the head showed findings suspicious  for acute or subacute ischemia.  Left inferior frontal gyrus is with no  mass effect or hemorrhage.  Bilateral carotid Doppler studies were also  done, which showed an  occlusion of the internal carotids bilaterally.  He subsequently was also found to have new onset atrial fibrillation  (with a controlled ventricular rate), which was presumed etiology of the  acute CVA.  He was started on Lovenox and aspirin.  In addition  according to the medical records from [facility], she was also  given Plavix at some point.  He had a CT of the chest upon his admission  to [facility], which showed a small right effusion with evidence of lower  lobe pneumonia.  A CT angiogram was negative for pulmonary embolus.  He  was treated with IV antibiotics.  A followup chest x-ray done on  August 05, 2008, showed an increase in the right pleural effusion.  He  underwent a thoracentesis where approximately 1.2 liters of bloody  fluid  was removed.  He apparently remained stable from a neurologic  standpoint.  A followup CT of the chest ensured a large right hemothorax  with compression of the right lung, and he underwent placement of the  right chest tube.  The chest tube was then removed approximately 2 days  after followup.  CT of the chest; however, showed a large right  hemopneumothorax.  The patient continued to have shortness of breath as  well as increased oxygen requirements.  He was transferred from [facility] to [facility] on August 18, 2008, for further thoracic  evaluation by Dr. Edwyna Shell.   HOSPITAL COURSE STAY:  Pulmonary and Neuro consults were obtained.  A CT  of the head that was done showed subacute cerebral infarct in the left  frontal parietal region.  No evidence of hemorrhage, mass effect, or  midline chest.  The patient underwent a right VAT, right thoracotomy,  right upper lobe lung biopsy, extensive decortication, and drainage of  the hemothorax by Dr. Edwyna Shell on August 19, 2008.  As previously  stated, pathology revealed pleural fibrosis and emphysematous changes of  the right upper lobe of the lung.  IV antibiotics were continued.  The  patient was eventually extubated on August 20, 2008.  He went back  into atrial fibrillation with controlled ventricular rate.  He was  started on an amiodarone drip.  Because of having had the previous CVA,  speech pathology consult was obtained.  In addition an electrophysiology  consult was also obtained.  An echocardiogram was done on August 22, 2008, which revealed a preserved EF of 55-60%.  Left atrium mildly  dilated.  No significant valvular disease.  The patient then underwent  direct current cardioversion on August 22, 2008.  The patient was  converted to normal sinus rhythm.  Amiodarone drip was stopped.  The  patient was placed on amiodarone p.o.  Posterior chest tube was removed  on August 23, 2008.  Followup chest x-ray  revealed no pneumothorax,  vascular congestion present.  Remaining chest tubes were removed by  August 27, 2008.  Followup chest x-ray again revealed no pneumothorax.   The patient then developed urinary retention requiring Foley  reinsertion.  Foley was unable to be removed secondary to stroke.  Urine  cultures x2 were obtained.  Both showed no growth.  The patient was then  evaluated for inpatient rehab by September 01, 2008, which was postop day  #13.  He was afebrile.  Heart rate 50-60, BP 140/80, O2 sat 90% on 2  liters nasal cannula.  CBGs were 157, 145, and 136 respectively.   PHYSICAL EXAMINATION:  CARDIOVASCULAR:  Regular rate and rhythm.  PULMONARY:  Decreased at the right base, mostly clear in the left.  ABDOMEN:  Soft and nontender.  Bowel sounds present throughout.  EXTREMITIES:  Lower extremity edema.  Chronic venous stasis changes  present.  CHEST:  Right chest wound is clean and dry.  NEUROLOGIC:  Right hemiparesis.   The patient was felt stable for rehab and he was transferred to  inpatient rehab on September 01, 2008.  Last laboratory studies done on this  date, H and H was 9.7-9.4, white count 8100, platelet count 337,000.  BMET done on this date, potassium 4.3, BUN and creatinine were 9 and  0.62 respectively.  Last chest x-ray also done on September 01, 2008, showed  no significant change in the course of lung markings with right pleural  thickening, stable cardiomegaly.   For complete list of medications, please see the medical record, but in  brief, enteric-coated aspirin 81 mg p.o. daily, Plavix 75 mg p.o. daily,  amiodarone 200 mg p.o. twice daily, Effexor 75 mg p.o. twice daily,  Niferex 150 mg p.o. twice daily, metoprolol 25 mg p.o. q.8 h., Aranesp  per pharmacy, Vicodin 5/500 one to two p.o. q.6 h. p.r.n. pain.      Doree Fudge, [place]      Ines Bloomer, M.D.  Electronically Signed    DZ/MEDQ  D:  09/22/2008  T:  09/23/2008  Job:  [id number]

## 2010-11-16 NOTE — Op Note (Signed)
[name], REWERTS NO.:  [id number]   MEDICAL RECORD NO.:  [id number]          PATIENT TYPE:  INP   LOCATION:  2311                         FACILITY:  [facility]   PHYSICIAN:  Marca Ancona, MD      DATE OF BIRTH:  1956-03-30   DATE OF PROCEDURE:  08/22/2008  DATE OF DISCHARGE:                               OPERATIVE REPORT   INDICATION:  Atrial fibrillation.   PROCEDURE:  After informed consent was obtained, the patient was sedated  by anesthesia using Pentothal.  Direct current cardioversion was carried  out at 150 joules synchronized.  The patient was successfully converted  to normal sinus rhythm.  The patient will continue on amiodarone.  The  patient is within 48 hours of the onset of his atrial fibrillation.  He  has not been anticoagulated due to recent significant bleeding.      Marca Ancona, MD  Electronically Signed     DM/MEDQ  D:  08/22/2008  T:  08/23/2008  Job:  [id number]   cc:   Duke Salvia, MD, [facility]

## 2010-11-16 NOTE — H&P (Signed)
NAMEQUANTEZ, SCHNYDER NO.:  [id number]   MEDICAL RECORD NO.:  [id number]          PATIENT TYPE:  IPS   LOCATION:  4030                         FACILITY:  [facility]   PHYSICIAN:  Ranelle Oyster, M.D.DATE OF BIRTH:  September 13, 1955   DATE OF ADMISSION:  09/01/2008  DATE OF DISCHARGE:                              HISTORY & PHYSICAL   PRIMARY CARE PHYSICIAN:  Meredith Mody, MD, [place], [place]   CHIEF COMPLAINTS:  Right-sided weakness, aphasia.   HISTORY OF PRESENT ILLNESS:  This is a 55 year old white male with  hypertension, diabetes, and COPD presenting on August 02, 2008 to  [facility] with right-sided chest pain, productive cough, and  altered mental status.  He received a Z-Pak and placed on Cipro for  presumed pneumonia and afterwards there was noted a worsening of his  confusion.  The patient developed dizziness and had reported fall  landing on his right side.  MRI at the [facility] showed acute  infarct in left insular cortex and extending to the adjacent corona  radiata.  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  CT of chest with right effusion and left lower lobe  pneumonia.  CT angio of the chest was negative for PE.  The patient  underwent thoracentesis on the right and 1.2 L of fluid was collected.  CT of the chest showed a large right hemothorax and the patient  underwent a placement of chest tube.  He was transferred to [facility] on August 18, 2008 and underwent a right-sided VATS and  drainage of hemathorax by Dr. Edwyna Shell.  Followed by Neuro, Dr. Terrace Arabia showed  a subacute cerebral infarct as of August 18, 2008 in the left frontal  parietal region.  On August 21, 2008, the patient developed atrial  fibrillation and was placed on Coumadin.  He is followed by Dr. Shirlee Latch  in Cardiology and underwent cardioversion on August 22, 2008 and  remains on amiodarone with subcu Lovenox being later  added.  Echocardiogram was unremarkable.  TEE is without effusion.  Speech  Language Pathology has been following for diet.  He was on a D1 diet  initially and then was advanced to regular.  He is on Plavix and aspirin  for stroke prophylaxis currently.  Voiding trial failed initially and a  Foley tube was reinserted, although the patient is complaining of more  discomfort in the penile area again today and he requests that it comes  out.  Rehab was consulted and after following, it was determined that he  could benefit and tolerate rehab and thus he was brought here today.   REVIEW OF SYSTEMS:  Notable for anxiety, depression, cough, and sputum  production.  Mood has been fair.  He is having some issues sleeping due  to frequent eruptions.  Other pertinent positives as above and full  review is in the written H and P.   PAST MEDICAL HISTORY:  1. Positive for hypertension.  2. Diabetes.  3. COPD.  4. GERD.  5. Anxiety/depression.  6. Hyperlipidemia.  7. Tobacco and alcohol use.  FAMILY HISTORY:  Positive for diabetes.   SOCIAL HISTORY:  The patient is married.  Self-employed motorcycle  repair person.  His wife works days.  They live in a one-level house  with no steps to enter.   ALLERGIES:  1. ATIVAN.  2. PERCOCET.  3. CIPRO.   HOME MEDICATIONS:  Clonidine, Azor, metformin, Effexor, aspirin,  Spiriva, Prilosec, multivitamin, Symbicort, Onglyza, Advair, lisinopril,  and simvastatin.   LABORATORY DATA:  Hemoglobin 9.7, white count 8.1, and platelets 337.  Sodium 138, potassium 4.3, BUN 9, and creatinine 0.6.   PHYSICAL EXAMINATION:  VITAL SIGN:  Blood pressure 148/80, pulse is 58,  respiratory rate 20, and temperature 97.0.  GENERAL:  The patient is sitting in his chair, pleasant, alert, and  oriented.  HEENT:  Pupils equal, round, and reactive to light.  EARS, NOSE, AND THROAT:  Unremarkable with fairly pink moist mucosa.  Dentition is borderline to poor.  NECK:   Supple without JVD or lymphadenopathy.  CHEST:  Notable for some decreased breath sounds along the right side,  although good air movement overall.  HEART:  Regular rate and rhythm without murmurs, rubs, or gallops.  EXTREMITIES:  1-2+ pitting edema in both legs, right more than left.  ABDOMEN:  Soft and nontender.  Bowel sounds are positive.  SKIN:  Notable for intact wounds and some chronic venous stasis signs in  both legs.  NEUROLOGIC:  Cranial nerves II-XII showed right central VII tongue  deviation.  Reflexes are 1+ on the left, 2+ on the right.  Sensation did  not appear to be grossly different from left to right per patient's  reactions today.  The patient does have word finding deficits, but was  able to identify 3/4 objects today.  He was able to follow certain  commands, but he often stutters and does have difficulty finding some  more complicated phrases.  Receptively, he is intact.  Judgment,  orientation, memory, and mood were all essentially within normal limits  considering language dysfunction.  Strength in right upper extremity was  improved to the 2/5 range for right lower extremity strength 0/5.  He  had some mild resting tone in the right lower extremity slightly  confounded by his edema also present there.   POST ADMISSION POSITION EVALUATION:  1. 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  2. The patient is admitted to receive collaborative interdisciplinary      care between the physiatrist, rehab nursing staff, and therapy      team.  3. The patient's level of medical complexity and substantial therapy      needs in context of that medical necessity cannot be provided at a      lesser intensity of care.  4. The patient has experienced substantial functional loss from his      baseline.  Upon functional assessment at the time of preadmission      screening, the patient was at a total assistance level for bed       mobility, transfers, ambulation, and dressing.  As of most recent      therapy evaluation within the last 24 hours, he is min to guard      assist to scoot to the edge of bed.  He was transferring mod to max      assist supine to sit in the bed.  He is performing stand to sit      transfers with min to  mod assist.  Gait has not been formally      tested.  He has been in the mod to max assist level for basic ADLs.      The patient was independent prior to arrival.  Judging by the      patient's diagnosis, physical exam, and functional history, the      patient has potential for functional progress which will result in      measurable gains while in inpatient rehab.  These gains will be of      substantial and practical use upon discharge to home in      facilitating mobility, self-care, etc.  His wife will assist in his      care at home.  Interim changes since our initial rehab evaluation      on August 22, 2008 are documented in the history of present      illness above.  5. Physiatrist will provide 24-hour management of medical needs as      well as oversight of therapy plan/treatment and provide guidance as      appropriate regarding interaction of the two.  Medical problem list      and plan are listed below.  6. 24-hour rehab nursing will assist in management of the patient's      bowel and bladder needs as well as skin care, nutrition, medication      administration, pain management, and integration of therapy      concepts, techniques, etc.  7. PT will assess and treat for lower extremity strengthening, range      of motion, neuromuscular reeducation, mobility techniques, gait,      adaptive equipment, and family education with goals at the modified      independent to supervision level with mobility.  8. OT will assess and treat for upper extremity use, neuromuscular      reeducation, adaptive equipment, ADLs, safety awareness,      appropriate family education tasks with  goals at the supervision to      min assist level.  9. Speech Language Pathology will assess and treat for dysphagia as      well as aphasia with modified independent goals.  10.Case manager/social worker will assess and treat for psychosocial      issues and discharge planning.  11.A team conference will be held weekly to assess progress towards      goals and to determine barriers to discharge.  12.The patient has demonstrated sufficient medical stability and      exercise capacity to tolerate at least 3 hours of therapy per day      at least 5 days per week.  13.Estimated length of stay is 3 weeks.  Prognosis is fair to good.   MEDICAL PROBLEM LIST AND PLAN:  1. Hemothorax status post video-assisted thoracoscopic surgery:      Continue to monitor the patient's breathing and respiratory status      clinically.  Follow wounds as appropriate.  Surgery followup is      indicated.  2. Atrial fibrillation:  Status post cardioversion.  Follow closely      for heart rate control and cardiovascular stability with exercise      increased exertion while on rehab.  Continue Lopressor as well as      amiodarone for now, blood pressure permitting.  3. Hypertension:  Continue Lopressor.  4. Chronic obstructive pulmonary disease:  Continue Spiriva, Xopenex,      and Nicoderm patches.  Follow  O2 sats and augment and adjust      medication regimen as needed.  The patient needs counseling      regarding tobacco abuse.  5. Diabetes:  Check CBCs before meals and at bedtime and cover sliding-      scale insulin as appropriate.  6. Urine retention:  Discontinue Foley catheter today and begin      voiding trial.  Check PVRs x3.  The patient may need time voids and      likely will need to be up to the edge of the bed or to the toilet      to maximize his voiding potential.  7. Anticoagulation with subcu Lovenox, aspirin, and Plavix per history      of present illness.  8. Depression:  Continue Effexor  for now.  We will have      Neuropsychology assess the patient as well for coping.  The patient      does not admit to any depression on exam today.  9. Anemia:  Continue Aranesp with Nu-Iron.  Check serial hemoglobins      and observe for any signs and symptoms of anemia.  Most recent      hemoglobin is 9.7, which will bear close watching.      Ranelle Oyster, M.D.  Electronically Signed    ZTS/MEDQ  D:  09/01/2008  T:  09/02/2008  Job:  [id number]   cc:   Meredith Mody, M.D.

## 2010-11-16 NOTE — H&P (Signed)
NAMEIBRAHAM, [name]             ACCOUNT NO.:  [id number]   MEDICAL RECORD NO.:  [id number]          PATIENT TYPE:  INP   LOCATION:  2014                         FACILITY:  [facility]   PHYSICIAN:  Ines Bloomer, M.D. DATE OF BIRTH:  [birth year]   DATE OF ADMISSION:  08/18/2008  DATE OF DISCHARGE:                              HISTORY & PHYSICAL   CHIEF COMPLAINT:  Right hemothorax.   HISTORY:  The patient is a 55 year old male who was transferred today  from [facility] for evaluation of a right hemothorax.  Approximately 2 weeks ago, he developed a cough with purulent sputum and  some chest discomfort.  He was seen by his primary care physician, who  initially treated him with a Z-Pak for a presumed bilateral lower lobe  pneumonia.  He was also given ibuprofen 800 mg, prednisone 20 mg, and  Percocet for chest discomfort, as it was felt that he had fractured a  rib.  According to history obtained from the patient and his wife, he  became confused and dizzy after taking the Percocet and went to lie down  in his bed when he had a coughing attack.  At that point, he sat up on  the side of the bed and either became dizzy or had a syncopal episode;  it is unclear based on his history, but he did sustain a fall off the  side of the bed onto his right side.  His symptoms did not significantly  improve, and he returned to his primary care physician's office where  his antibiotic coverage was switched to Cipro.  Apparently, he took one  dose of the Cipro and became confused with slurred speech and  subsequently developed a right-sided weakness.  When this did not  resolve, his wife took him to the emergency department at [facility], and he was admitted on August 02, 2008, for treatment for an  acute CVA.  He underwent a CT of the head, which showed findings  suspicious for acute or subacute ischemia in the left inferior frontal  gyrus with no mass effect or hemorrhage.  He also  underwent bilateral  carotid Doppler studies, which showed occlusion of the internal carotids  bilaterally.  He was also noted to be in new onset atrial fibrillation  with controlled rate, which was presumed to be the etiology of his CVA.  According to the history and physical, he was to be started on full-dose  Lovenox and aspirin.  From other notes obtained from [facility], it looks  like he was also given Plavix at one point, but it is unclear what other  anticoagulation he received.  He also had a chest CT upon admission,  which showed a small right effusion with evidence of lower lobe  pneumonia.  CT angiogram was negative for pulmonary embolus.  He was  treated with IV antibiotics and had a repeat chest x-ray on February 2.  At that time, he was noted to have an increase in the effusion on the  right side and underwent a thoracentesis, at which point, 1.2 L of  bloody fluid was  removed.  He has remained stable from a neurologic  standpoint; however, his pulmonary status has been slow to improve.  A  followup CT showed a large right hemothorax with compression of the  right lung.  He underwent placement of the chest tube with drainage of  bloody effusion.  According to his wife, the chest tube was removed  approximately 2 days ago after his drainage decreased.  He continues to  have evidence on CT of a large hemopneumothorax on the right.  He also  continues to have shortness of breath and increased O2 requirements.  Subjectively, his cough has improved, and he has not run a fever.  He  does complain of some wheezing but does not really feel short of breath,  as he did prior to admission.  It was felt that he would probably  require a VAT drainage at this time, and he was transferred today under  the care of Dr. Dewayne Shorter for thoracic surgery evaluation.   PAST MEDICAL HISTORY:  1. Hypertension.  2. Type 2 diabetes mellitus.  3. Gastroesophageal reflux disease.  4.  Anxiety/depression.  5. COPD.  6. Obesity.  7. Alcohol and tobacco abuse.  8. Status post acute CVA.  9. Newly diagnosed atrial fibrillation, currently in normal sinus      rhythm.   HOME MEDICATIONS:  1. Clonidine 0.2 mg daily.  2. Azor 10/20 mg daily.  3. Metformin 1000 mg b.i.d.  4. Effexor 75 mg b.i.d.  5. Aspirin 81 mg daily.  6. Spiriva 80 mcg daily.  7. Prilosec OTC 20 mg daily.  8. Multivitamin daily.  9. Bilberry supplement daily.  10.Symbicort 1 puff daily.  11.Onglyza 5 mg daily.  12.He had been taking ibuprofen 800 mg t.i.d. as well as Percocet      5/325 mg b.i.d., prednisone 20 mg taper, and Z-Pak.  Upon transfer      from [facility], he was noted to be on Spiriva 80 mcg daily, Protonix      40 mg daily.  13.Nicotine patch 21 mg daily.  14.Advair 250/50 one puff b.i.d.  15.Lantus 10 units subcu at bedtime.  16.Lisinopril 10 mg daily.  17.Simvastatin 40 mg at bedtime.  18.Aspirin 325 mg daily.   ALLERGIES:  No known drug allergies, although at home, he did develop  confusion following PERCOCET and CIPRO, although it is unclear if this  was secondary to his CVA.   SOCIAL HISTORY:  He is married and lives with his wife in [place],  [place].  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  He admits to smoking 3 packs of cigarettes per day x45 years.  He drinks  12-15 12-ounce cans of beer daily.  He denies further drug use.   FAMILY HISTORY:  His mother is deceased and had diabetes mellitus.  His  father is deceased and had an aneurysm.  He has one sister, who is  status post CVA.  There is also a strong family history of diabetes and  hypertension.   REVIEW OF SYSTEMS:  See history of present illness for pertinent  positives and negatives.  The patient was in his usual state of health  prior to admission and denies any visual changes, amaurosis fugax,  syncope, presyncope, dysphagia, dysarthria, fevers, chills, weight loss,  chest pain,  heart palpitations, abdominal pain, nausea, vomiting,  diarrhea, constipation, hematemesis, hematochezia, melena, hemoptysis,  lower extremity edema, intolerance to heat or cold, muscle pain or joint  discomfort.  PHYSICAL EXAMINATION:  VITAL SIGNS:  Blood pressure is 119/63, pulse is  77 and regular, respirations 18 and unlabored, temperature 97.8, O2 sat  96% on 3 L.  GENERAL:  This is an obese white male, who appears to be breathing  easily, in no acute distress.  HEENT:  Normocephalic, atraumatic.  Pupils are equal, round, and react  to light and accommodation.  Extraocular movements intact.  Ears and  nose reveal no abnormalities.  Oropharynx is clear.  NECK:  Supple without lymphadenopathy or thyromegaly.  No bruits.  LUNGS:  Inspiratory and expiratory wheezes in the upper airways  bilaterally.  Markedly diminished breath sounds in the right base.  He  has an open chest tube insertion site, which has a small area of dried  blood in the right midaxillary line.  HEART:  Regular rate and rhythm without murmurs, rubs, or gallops.  ABDOMEN:  Obese, soft, nontender.  Positive bowel sounds.  There is  resolving ecchymosis present in the lower portion of the abdomen  bilaterally as well as into the right chest area.  EXTREMITIES:  His lower extremities are cool with venous stasis changes,  worse on the left than the right.  He has palpable femoral pulses and no  palpable pedal pulses.  There is edema of the right lower extremity.  NEUROLOGIC:  He is alert and oriented, although he does have an  expressive aphasia.  Face appears symmetrical.  He has diminished  strength of the right upper and lower extremities and decreased motor  function of his right leg.  His leg grip is diminished as well.   ASSESSMENT/PLAN:  This is a 55 year old male with a complex  presentation.  It does appear that he has a right hemothorax, which will  require VAT drainage.  Dr. Edwyna Shell has seen and evaluated  the patient and  reviewed his films.  He has discussed all risks, benefits, and  alternatives of surgery.  We will plan on proceeding with the right VAT,  decortication, and drainage of effusion on August 19, 2008.  In light  of his recent CVA, we will have Neurology see him prior to surgery and  hold off on any other anticoagulants for now.      Coral Ceo, P.A.      Ines Bloomer, M.D.  Electronically Signed    GC/MEDQ  D:  08/18/2008  T:  08/19/2008  Job:  [id number]   cc:   Meredith Mody, MD

## 2010-11-16 NOTE — Consult Note (Signed)
NAMEJAMISON, SOWARD             ACCOUNT NO.:  [id number]   MEDICAL RECORD NO.:  [id number]          PATIENT TYPE:  INP   LOCATION:  2014                         FACILITY:  [facility]   PHYSICIAN:  Levert Feinstein, MD          DATE OF BIRTH:  [birth year]   DATE OF CONSULTATION:  DATE OF DISCHARGE:                                 CONSULTATION   CHIEF COMPLAINT:  This is a consult from thoracic surgeon, Dr. Karle Plumber for evaluation of acute stroke.   HISTORY OF PRESENT ILLNESS:  The patient is a 55 year old right-handed  obese Caucasian male, the history is from reviewing the chart and talk  with his wife.   Prior to his recent hospital admission to [facility] in January 2010, he  had right chest pain, cough, and increased secretion.  He has developed  acute confusion after taking Cipro on August 02, 2008, which has  leading to his hospital admission.  MRI on August 04, 2008, has  demonstrated acute stroke involving left insular cortex.  Prior to the  MRI, he had slurred speech, confusion, and mild right upper extremity  weakness.  Apparently, he has received multiple sedative medications  including Percocet and Ativan during MRI.  Afterwards, he became more  confused.  Also developed profound right lower extremity weakness, has  been static since its onset.   In addition, the patient has past medical history of 2-1/2 packs smoking  daily, hypertension, hyperlipidemia, diabetes, alcoholic, and he was  diagnosed with atrial fibrillation since his hospital admission.  He was  fully anticoagulated with heparin plus Plavix.  On repeat chest x-ray  and CAT scan, he was found to have right pleural effusion.  Thoracenteses demonstrate it was hemorrhagic effusion.  He was  transferred to [facility] for potential exploratory surgery of his right  chest.   He is currently on aspirin, has improved swallowing, remained aphasic,  and remained profound right leg more than arm weakness, but denies  visual change, or right side sensory change.   In addition, echocardiogram demonstrated normal left ventricular  function.  Ultrasound of the carotid artery demonstrated occlusion of  bilateral internal carotid artery.   REVIEW OF SYSTEMS:  Pertinent as above.   PAST MEDICAL HISTORY:  Two and a half pack daily smoking, hypertension,  hyperlipidemia, diabetes, and alcoholic.   Medical history also including COPD, obesity, anxiety, and depression.   ALLERGIES:  No known drug allergies.   SOCIAL HISTORY:  He is married.  He lives with his wife in [place].  Smokes two to three packs of cigarettes every day.  Drink 15 to 12  ounces of beer daily.  No history of illicit drugs.  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   FAMILY HISTORY:  Mother died of complication of diabetes.  His father is  diseased, etiology unknown.   MEDICATIONS:  Aspirin, Rocephin, fluticasone inhaler, Lantus,  Glucophage, metoprolol, 25 b.i.d., nicotine patch, Protonix, Zocor,  normal saline, Spiriva, Effexor, and Zofran p.r.n.   PHYSICAL EXAMINATION:  VITAL SIGNS:  Temperature 97.8, blood pressure  119/68, heart rate of 77, and respirations of 18.  CARDIAC:  Regular rate and rhythm now.  NECK:  Supple.  No carotid bruits.  NEUROLOGIC:  He is awake, following commands, shortness of breath with  prolonged talking, aphasia, fluent with frequent paraphasic errors, he  has difficulty naming, repeating, and reading; and mild comprehension  difficulty, and difficulty following three-step commands.  Cranial  nerves II through XII.  He has pupil equal, round, and reactive to  light.  Right fundus was sharp.  Extraocular movements were full.  He  has right visual field cut, right upper motor neuron seventh palsy.  Motor examination:  Right upper extremity proximal 3/5, distal 2/5.  Right lower extremity 0/5.  Sensory:  There was no extinction on double  spontaneous  stimulation.  Deep tendon reflexes were present, plantar  responses on the right side was extensor, left side was flexor, and he  has no dysmetria.  NIH stroke scale was 11.   Reviewed MRI with radiologist, which was done in August 05, 2007 at  [facility].  There was acute stroke involving left insular  cortex, some extending to the adjacent corona radiata, but sparing the  posterior limb of the left internal capsule.   ASSESSMENT AND PLAN:  A 55 year old with multiple vascular risk factors  including occlusion of bilateral internal carotid artery, hypertension,  hyperlipidemia, diabetes, smoker, alcoholic, obesity, and probably  obstructive sleep apnea presenting with left middle cerebral artery  stroke.  From the history, he seems to have worsening neurological  status since recent MRI in August 04, 2008, likely extending to  involving posterior limb of left internal capsule, (M1 segment).  1. STAT MRI of the brain, MRA of the brain.  2. Keep aspirin.   Addendum: patient is not suitable candidate for MRI due to agitation and  big body habitus, order CT head instead      Levert Feinstein, MD  Electronically Signed     YY/MEDQ  D:  08/18/2008  T:  08/19/2008  Job:  [id number]

## 2010-11-16 NOTE — Assessment & Plan Note (Signed)
[name] is back regarding his left frontoparietal stroke.  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  He is not really  involved in physical aspects of labor.  He denies pain.  He was placed  on Coumadin by Cardiology recently for stroke prophylaxis.  He is  walking without a device.  He still complains of word-finding deficits  which frustrate him and the limp, but otherwise he feels that he is  doing quite well.  He started driving again 2 weeks ago without  permission.   REVIEW OF SYSTEMS:  Notable for a limp as noted above.  Occasional high  sugars.  He notes occasional swelling as well.  Other pertinent  positives are above, and full review is in the written health and  history section of the chart.   SOCIAL HISTORY:  The patient is married.  He lives with his wife.  He is  still drinking 8-12 bears a day.   PHYSICAL EXAMINATION:  VITAL SIGNS:  Blood pressure 148/70, pulse is 63,  respiratory rate 18.  He is sating 97% on room air.  GENERAL:  The patient is pleasant, alert and oriented x3.  Affect is  generally bright and appropriate.  Speech is dysarthric.  He has ongoing  word-finding deficits.  He has decreased fine motor coordination of the  right hand with slightly decreased sensation in the right arm and leg.  HEART:  Regular.  CHEST:  Clear.  ABDOMEN:  Soft, nontender.  EXTREMITIES:  Strength essentially was 4+/5 in the arm, at the shoulder,  elbow, wrist, and 2+-3/5 at the hand intrinsics.  Right lower extremity  strength is again better proximal and distal with 4/5 strength  proximally to 2+-3/5 strength distally at the ankle with dorsiflexion  and plantar flexion.  The patient walks with a limp, but generally is  compensated and does not use an adaptive device.  PSYCHIATRIC:  He has fair awareness and insight.  Memory is good.  SKIN:  Generally intact.  He had some trace edema on the right upper and  lower extremity today.  LUNGS:  Breathing was  notable for some rhonchi and coarse breath sounds.   ASSESSMENT:  1. Left frontoparietal cerebrovascular accident.  2. History of hypertension.  3. Atrial fibrillation.  4. History of alcohol abuse.   PLAN:  1. The patient will begin Coumadin per Cardiology recommendations.  I      may give Dr. Shirlee Avila a call to discuss.  I am not sure how aware he      is of his other issues such as the drinking.  2. Continue with PT, OT, and Speech.  He is making nice progress.      Encouraged use of his language and right hand to improve dexterity      and language function.  3. Counseled regarding appropriate lifestyle changes and habits.  I      feel that he is at high risk for another stroke here considering      his lack of awareness and insight regarding his medical condition      and risk factors.  4. Gave the patient permission to drive.  I feel he is generally safe      to drive locally at this point.  5. I will see him back in 3 months' time.      [name], M.D.  Electronically Signed     ZTS/MedQ  D:  10/21/2008 11:25:39  T:  10/22/2008 00:43:43  Job #:  [id number]   cc:   Benjamin Ancona, MD  [address]  [place] [address]

## 2010-11-16 NOTE — Letter (Signed)
December 17, 2008   Nettie Elm.  Park, MD  [address]  [place], [place]  [address]   Re:  [name], [name]               DOB:  01-18-1956   Dear Dr. Willaim Bane:   I saw the patient back today.  His chest x-ray showed just some pleural  thickening.  No evidence of recurrence of any of the hemothorax.  He  continues to improve from his stroke.  He says he is riding his bike  again.  He had a recent injury to his left toe, which was sutured and  does look like is healing satisfactory.  From my standpoint, he looks  remarkably well considering where we started.  His blood pressure was  168/82, pulse 72, respirations 18, and saturations were 90%.  I will be  happy to see him again if he has any future problems.   Ines Bloomer, M.D.  Electronically Signed   DPB/MEDQ  D:  12/17/2008  T:  12/18/2008  Job:  [id number]

## 2010-11-16 NOTE — Assessment & Plan Note (Signed)
OFFICE VISIT   [name], [name]  DOB:  [birth year]                                        September 16, 2008  CHART #:  [id number]   Dr. Willaim Bane I saw the patient in follow-up today.  His blood pressure is  155/73, pulse 68, respirations 8, and 93%.  His incisions were healing  well.  Chest tubes sites had some erythema but they were improving.  His  chest x-ray showed normal postoperative changes.  There is still some  scarring from his hemothorax.  He really had a large hemothorax on that  side.  I will plan to see him back again in 4 weeks with a chest x-ray.  He has made a remarkable improvement from the status of his major stroke  and I hope that he continues to improve.   Ines Bloomer, M.D.  Electronically Signed   DPB/MEDQ  D:  09/16/2008  T:  09/16/2008  Job:  [id number]   cc:   Meredith Mody, MD

## 2010-11-16 NOTE — Op Note (Signed)
NAMEGENEROSO, CROPPER             ACCOUNT NO.:  [id number]   MEDICAL RECORD NO.:  [id number]          PATIENT TYPE:  INP   LOCATION:  2014                         FACILITY:  [facility]   PHYSICIAN:  Ines Bloomer, M.D. DATE OF BIRTH:  [birth year]   DATE OF PROCEDURE:  DATE OF DISCHARGE:                               OPERATIVE REPORT   PREOPERATIVE DIAGNOSIS:  Hemothorax.   POSTOPERATIVE DIAGNOSIS:  Status post left cerebrovascular accident,  right hemothorax.   OPERATION PERFORMED:  Right VATS, right thoracotomy, right drainage of  hemothorax with decortication.   SURGEON:  Ines Bloomer, MD   FIRST ASSISTANT:  Doree Fudge, PA.   ANESTHESIA:  General anesthesia.   After percutaneous insertion of all monitoring lines, the patient under  general anesthesia was prepped and draped in the usual sterile manner.  The patient was turned to the right thoracotomy position.  A dual-lumen  tube has been inserted.  The right lung was deflated.  The patient has  been transferred to the [facility] where he was admitted on  August 02, 2008, with the stroke and the new onset of atrial fib.  Apparently, he was placed on heparin and then aspirated on Plavix, and  the patient with also history of alcohol intake.  He then within the  first week developed right hemothorax in a which a thoracentesis and  then a chest tube was inserted with inadequate drainage of the  hemothorax.  He was transferred here for treatment, CT scan done 2 days  previously still showed a large right hemothorax, and he was prepped and  draped in the usual sterile manner.  Three trocar sites were made in the  anterior and posterior axillary line at the seventh intercostal space  and 2 trocars were inserted.  A 0 degree scope was inserted and all we  could see was just a clotted hemothorax, and we could not evacuate the  clotted blood.  So we ended up doing an anterior thoracotomy with the  fifth intercostal  space partially dividing the latissimus, and splitting  the serratus.  The fifth intercostal space was entered, and a  Finochietto retractor was inserted.  The patient was given 2 FFPs  because of a slightly prolonged protime and 2 units of blood during the  procedure.  There was marked amount of blood in chest and we first freed  up the lung, taken off the old blood out of the chest, sending some for  culture as well as then stripping inflammatory peel off the parietal  pleura medially and laterally.  We freed the lung up off the middle  mediastinum and the pericardium and then finally off the diaphragm .  There was a very thickened peel in the right middle lobe and the right  lower lobe and approximately 50% of the right upper lobe.  We then  proceeded to do a decortication, stripping off the thickened peel with  sharp and blunt dissection using Kitner's forceps.  Several holes in the  lung were repaired with 3-0 Vicryl in a figure-of-eight fashion after  all the decortication had been done,  which took about an hour and a half  to do including for freeing up the fissures.  Three chest tubes were  inserted 2 through the trocar sites, which were straight 36 and then  through incision in between these 2 right angle 36.  The On-Q was  inserted in the usual fashion.  A Marcaine block was done in the usual  fashion.  The chest was closed with 6 pericostals and #2 Vicryl, #1  Vicryl in the muscle layer, 2-0 Vicryl in the subcutaneous tissue, and  Dermabond for the skin.  The patient was returned to the recovery room  on the respirator in serious condition.      Ines Bloomer, M.D.  Electronically Signed     DPB/MEDQ  D:  08/19/2008  T:  08/20/2008  Job:  [id number]

## 2010-11-16 NOTE — Discharge Summary (Signed)
[name] NO.:  [id number]   MEDICAL RECORD NO.:  [id number]          PATIENT TYPE:  IPS   LOCATION:  4030                         FACILITY:  [facility]   PHYSICIAN:  Ranelle Oyster, M.D.DATE OF BIRTH:  07-08-1955   DATE OF ADMISSION:  09/01/2008  DATE OF DISCHARGE:  09/10/2008                               DISCHARGE SUMMARY   DISCHARGE DIAGNOSES:  1. Left frontoparietal cerebrovascular accident.  2. Dysphagia.  3. Right hemothorax with status post video-assisted thoracoscopic      surgery and drainage of hemothorax, August 18, 2008.  4. Atrial fibrillation with cardioversion, August 22, 2008.  5. Hypertension.  6. Chronic obstructive pulmonary disease.  7. Non-insulin-dependent diabetes mellitus.  8. Hyperlipidemia.  9. Urinary retention, resolved.  10.Subcutaneous Lovenox for deep vein thrombosis prophylaxis.  11.Depression, anxiety.  12.Gastroesophageal reflux disease.  13.Anemia.   SUMMARY:  This is a 55 year old white male with history of tobacco  abuse, chronic obstructive pulmonary disease, presented on July 06, 2008, to [facility] with right chest pain, productive  cough, and altered mental status.  He had recently received a Z-Pak and  placed on Cipro for presumed pneumonia, noted worsening of confusion.  Developed dizziness and also had reported fall, landing on his right  side.  MRI at [facility] showed acute infarction, left insular  cortex extending adjacent corona radiata.  Carotid Dopplers with  occlusion, bilateral internal carotids.  CT of the chest with right  effusion and left lower lobe pneumonia.  CT angio of the chest, negative  for pulmonary embolism.  Underwent thoracentesis on the right with 1.2 L  yield.  CT of the chest with large right hemothorax, underwent placement  of chest tube.  The patient was transferred to [facility] on  August 18, 2008, underwent VATS and drainage of  hemothorax per Dr.  Edwyna Shell.  Followup Neurology Services, Dr. Terrace Arabia with cranial CT scan on  August 18, 2008, showing subacute cerebral infarction, left  frontoparietal region.  On August 21, 2008, with atrial fibrillation,  placed on amiodarone.  Cardiology Services, [place], underwent  cardioversion on August 22, 2008, remained on amiodarone.  Subcutaneous Lovenox added for deep vein thrombosis prophylaxis.  Echocardiogram with ejection fraction of 60% without emboli.  TEE  without effusion.  Speech therapy followup, diet slowly advanced.  Placed on aspirin and Plavix for cerebrovascular accident.  The patient  was admitted for comprehensive rehab program.   PAST MEDICAL HISTORY:  See discharge diagnoses.  He smokes 2-1/2 packs  per day.  Noted alcohol use.   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   FUNCTIONAL HISTORY PRIOR TO ADMISSION:  Independent.   FUNCTIONAL STATUS UPON ADMISSION TO REHAB SERVICES:  Minimal guard to  scoot the edge of bed, transfers supine to side to sit with total  assistance; stand, sit, stand +2 total assist; and gait was not tested.   MEDICATIONS PRIOR TO ADMISSION TO [facility]:  1. Clonidine 0.2 mg daily.  2. Azor 10/20 daily.  3. Metformin 1000 mg twice  daily.  4. Effexor 75 mg twice daily.  5. Aspirin 81 mg daily.  6. Spiriva 1 puff daily.  7. Prilosec over-the-counter.  8. Multivitamin daily.  9. Symbicort daily.  10.Advair 250/50 mg twice daily.  11.Lisinopril 10 mg daily.  12.Simvastatin 40 mg daily.   ALLERGIES:  Ativan, Percocet, and Cipro.   PHYSICAL EXAMINATION:  VITAL SIGNS:  Blood pressure 148/80, pulse 58,  temperature 97, respirations 20.  GENERAL:  This was an alert male, in no acute distress.  Follows 2-step  commands.  Speech was halting with decreased sequencing.  Appropriate to  yes/no.  He names person and date of birth.  NEUROLOGIC:  Deep tendon reflexes  were 2+.  Calves remained cool without  any swelling, erythema, nontender.  Sensation decreased to the right  side to light touch.  LUNGS:  Clear to auscultation.  CARDIAC:  Regular rate and rhythm.  ABDOMEN:  Soft, nontender.  Good bowel sounds.   REHABILITATION HOSPITAL COURSE:  The patient was admitted to Inpatient  Rehab Services with therapies initiated on a 3-hour daily basis  consisting of physical therapy, occupational therapy, speech therapy,  and rehabilitation nursing.  The following issues were addressed during  the patient's rehabilitation stay.  1. Pertaining to [name]'s left frontoparietal cerebrovascular      accident, he remained on aspirin and Plavix therapy.  He continued      to attend full therapies.  His diet was slowly advanced to a      regular consistency, which he tolerated well.  During his hospital      stay, he had undergone VATS for right hemothorax per Dr. Edwyna Shell on      August 18, 2008.  Surgical site healing nicely.  No shortness of      breath noted.  Cardiology followup for atrial fibrillation,      hypertension.  He had undergone cardioversion on August 22, 2008.      Blood pressure is well controlled as well as heart rate with      amiodarone, lisinopril, and Lopressor.  He would follow up with      Cardiology Services.  The patient had a long documented history of      chronic obstructive pulmonary disease with tobacco abuse.  He      remained on Spiriva as well as Advair.  He had also received full      counseling in regards to no smoking.  It was questionable if he      would be compliant with these requests.  2. Non-insulin-dependent diabetes mellitus.  Blood sugar is overall      well controlled.  He had been on Glucophage in the past.  He would      follow up with his primary MD for ongoing recommendations as      advised.  Latest blood sugars 97, 115, and 112.  He will continue      with his Crestor for history of hyperlipidemia.   Initially, he did      have some urinary retention after removal of Foley catheter tube;      however, as his mobility improved, he is able to void without      difficulty.  He remained on Effexor for history of depression,      anxiety with emotional support provided.  Subcutaneous Lovenox was      ongoing for deep vein thrombosis prophylaxis.   The patient received weekly collaborative interdisciplinary team  conferences to discuss  estimated length of stay.  Ongoing family  teaching in any barriers to discharge.  He was overall for his  therapies.  He was ambulating with minimal assist with a walker, 60-80  feet on even surfaces; able to propel his wheelchair with minimal  assistance.  He still needed minimal assist for lower body activities of  daily livings, simple set up for upper body dressing.  He still had some  expressive aphasia, although this was improving with mild apraxia.  He  exhibited no unsafe behavior.  He was discharged to home.   Latest labs showed a hemoglobin 11.2, hematocrit 32.1, sodium 138,  potassium 4.3, BUN 8, creatinine 0.6.  The patient was discharged to  home.   DISCHARGE MEDICATIONS:  1. Folic acid 1 mg daily.  2. Multivitamin daily.  3. Amiodarone 200 mg 2 tablets every 12 hours.  4. Simvastatin 40 mg daily.  5. Effexor 75 mg twice daily.  6. Spiriva 18 mcg daily.  7. Aspirin 81 mg daily.  8. Nu-Iron 150 mg twice daily.  9. Plavix 75 mg daily.  10.Protonix 40 mg daily.  11.Lisinopril 10 mg daily.  12.Lopressor 50 mg twice daily.  13.Advair 250/50 Diskus 1 puff twice daily.  14.Tapering of Nicoderm patch.   He would follow up with Dr. Faith Rogue at the Outpatient Rehab  Service office as advised; Dr. Meredith Mody, [place], [place]; Dr. Terrace Arabia,  Neurology Services; Dr. Shirlee Latch of Cardiology Services, call for  appointment.  He was advised no smoking, no drinking, no driving.      [name], P.A.      Ranelle Oyster, M.D.   Electronically Signed    DA/MEDQ  D:  09/10/2008  T:  09/10/2008  Job:  [id number]   cc:   Dr. Meredith Mody  Marca Ancona, MD

## 2010-11-23 ENCOUNTER — Other Ambulatory Visit: Payer: Self-pay | Admitting: Cardiology

## 2010-12-07 ENCOUNTER — Other Ambulatory Visit: Payer: Self-pay | Admitting: *Deleted

## 2010-12-07 ENCOUNTER — Ambulatory Visit (INDEPENDENT_AMBULATORY_CARE_PROVIDER_SITE_OTHER): Payer: PRIVATE HEALTH INSURANCE | Admitting: *Deleted

## 2010-12-07 DIAGNOSIS — Z7901 Long term (current) use of anticoagulants: Secondary | ICD-10-CM

## 2010-12-07 DIAGNOSIS — I4891 Unspecified atrial fibrillation: Secondary | ICD-10-CM

## 2010-12-07 DIAGNOSIS — I6350 Cerebral infarction due to unspecified occlusion or stenosis of unspecified cerebral artery: Secondary | ICD-10-CM

## 2010-12-07 MED ORDER — NIFEDIPINE ER OSMOTIC RELEASE 30 MG PO TB24: 30.0000 mg | ORAL_TABLET | Freq: Every day | ORAL | Status: DC

## 2010-12-27 ENCOUNTER — Other Ambulatory Visit: Payer: Self-pay | Admitting: *Deleted

## 2010-12-27 MED ORDER — WARFARIN SODIUM 5 MG PO TABS: 5.0000 mg | ORAL_TABLET | ORAL | Status: DC

## 2011-01-11 ENCOUNTER — Ambulatory Visit (INDEPENDENT_AMBULATORY_CARE_PROVIDER_SITE_OTHER): Payer: Medicare Other | Admitting: *Deleted

## 2011-01-11 DIAGNOSIS — I6350 Cerebral infarction due to unspecified occlusion or stenosis of unspecified cerebral artery: Secondary | ICD-10-CM

## 2011-01-11 DIAGNOSIS — Z7901 Long term (current) use of anticoagulants: Secondary | ICD-10-CM

## 2011-01-11 DIAGNOSIS — I4891 Unspecified atrial fibrillation: Secondary | ICD-10-CM

## 2011-01-17 ENCOUNTER — Other Ambulatory Visit: Payer: Self-pay | Admitting: Cardiology

## 2011-01-28 ENCOUNTER — Other Ambulatory Visit: Payer: Self-pay | Admitting: Cardiology

## 2011-01-28 ENCOUNTER — Encounter: Payer: Self-pay | Admitting: Cardiology

## 2011-01-28 DIAGNOSIS — R0602 Shortness of breath: Secondary | ICD-10-CM

## 2011-01-28 DIAGNOSIS — I4891 Unspecified atrial fibrillation: Secondary | ICD-10-CM

## 2011-02-08 ENCOUNTER — Ambulatory Visit (INDEPENDENT_AMBULATORY_CARE_PROVIDER_SITE_OTHER): Payer: Medicare Other | Admitting: *Deleted

## 2011-02-08 DIAGNOSIS — Z7901 Long term (current) use of anticoagulants: Secondary | ICD-10-CM

## 2011-02-08 DIAGNOSIS — I6350 Cerebral infarction due to unspecified occlusion or stenosis of unspecified cerebral artery: Secondary | ICD-10-CM

## 2011-02-08 DIAGNOSIS — I4891 Unspecified atrial fibrillation: Secondary | ICD-10-CM

## 2011-02-11 ENCOUNTER — Other Ambulatory Visit: Payer: Self-pay | Admitting: Cardiology

## 2011-02-11 DIAGNOSIS — I4891 Unspecified atrial fibrillation: Secondary | ICD-10-CM

## 2011-02-11 DIAGNOSIS — R0602 Shortness of breath: Secondary | ICD-10-CM

## 2011-02-22 ENCOUNTER — Ambulatory Visit (INDEPENDENT_AMBULATORY_CARE_PROVIDER_SITE_OTHER): Payer: Medicare Other | Admitting: *Deleted

## 2011-02-22 DIAGNOSIS — Z7901 Long term (current) use of anticoagulants: Secondary | ICD-10-CM

## 2011-02-22 DIAGNOSIS — I6350 Cerebral infarction due to unspecified occlusion or stenosis of unspecified cerebral artery: Secondary | ICD-10-CM

## 2011-02-22 DIAGNOSIS — I4891 Unspecified atrial fibrillation: Secondary | ICD-10-CM

## 2011-03-03 ENCOUNTER — Other Ambulatory Visit: Payer: Self-pay | Admitting: Cardiology

## 2011-03-14 ENCOUNTER — Other Ambulatory Visit: Payer: Self-pay | Admitting: Cardiology

## 2011-03-14 ENCOUNTER — Other Ambulatory Visit (INDEPENDENT_AMBULATORY_CARE_PROVIDER_SITE_OTHER): Payer: Self-pay | Admitting: Cardiology

## 2011-03-14 DIAGNOSIS — I4891 Unspecified atrial fibrillation: Secondary | ICD-10-CM

## 2011-03-14 DIAGNOSIS — I6350 Cerebral infarction due to unspecified occlusion or stenosis of unspecified cerebral artery: Secondary | ICD-10-CM

## 2011-03-14 DIAGNOSIS — Z7901 Long term (current) use of anticoagulants: Secondary | ICD-10-CM

## 2011-03-14 DIAGNOSIS — R0989 Other specified symptoms and signs involving the circulatory and respiratory systems: Secondary | ICD-10-CM

## 2011-03-15 ENCOUNTER — Ambulatory Visit (INDEPENDENT_AMBULATORY_CARE_PROVIDER_SITE_OTHER): Payer: Medicare Other | Admitting: *Deleted

## 2011-03-15 ENCOUNTER — Encounter (HOSPITAL_COMMUNITY): Payer: Self-pay

## 2011-03-15 ENCOUNTER — Encounter (HOSPITAL_COMMUNITY)
Admission: RE | Admit: 2011-03-15 | Discharge: 2011-03-15 | Disposition: A | Discharge status: Home / Self Care | Payer: Medicare Other | Source: Ambulatory Visit | Attending: Cardiology | Admitting: Cardiology

## 2011-03-15 DIAGNOSIS — R0989 Other specified symptoms and signs involving the circulatory and respiratory systems: Secondary | ICD-10-CM | POA: Insufficient documentation

## 2011-03-15 DIAGNOSIS — R0602 Shortness of breath: Secondary | ICD-10-CM

## 2011-03-15 DIAGNOSIS — R0789 Other chest pain: Secondary | ICD-10-CM | POA: Insufficient documentation

## 2011-03-15 DIAGNOSIS — R0609 Other forms of dyspnea: Secondary | ICD-10-CM | POA: Insufficient documentation

## 2011-03-15 MED ORDER — TECHNETIUM TC 99M TETROFOSMIN IV KIT
30.0000 | PACK | Freq: Once | INTRAVENOUS | Status: AC | PRN
  Administered 2011-03-15: 29.9 via INTRAVENOUS

## 2011-03-15 MED ORDER — TECHNETIUM TC 99M TETROFOSMIN IV KIT
10.0000 | PACK | Freq: Once | INTRAVENOUS | Status: AC | PRN
  Administered 2011-03-15: 9.5 via INTRAVENOUS

## 2011-03-15 NOTE — Progress Notes (Signed)
Stress Lab Nurses Notes - [name] 03/15/2011  Reason for doing test: Dyspnea  Type of test: [name]  Nurse performing test: [name], RN  Nuclear Medicine Tech: [name]  Echo Tech: Not Applicable  MD performing test: R. Rothbart  Family MD: [facility] explained and consent signed: yes  IV started: 22g jelco, Saline lock flushed, No redness or edema and Saline lock started in radiology  Symptoms: None  Treatment/Intervention: None  Reason test stopped: protocol completed  After recovery IV was: Discontinued via X-ray tech and No redness or edema  Patient to return to Nuc. Med at :  11:45  Patient discharged: Home  Patient's Condition upon discharge was: stable  Comments: BP during test 132/68 HR 75. BP in recovery 122/72 HR 69.  No symptoms noted.  [name]

## 2011-03-25 ENCOUNTER — Ambulatory Visit: Payer: Medicare Other | Admitting: Cardiology

## 2011-03-28 ENCOUNTER — Encounter: Payer: Self-pay | Admitting: Cardiology

## 2011-03-29 ENCOUNTER — Encounter: Payer: Medicare Other | Admitting: *Deleted

## 2011-03-29 ENCOUNTER — Ambulatory Visit: Payer: Medicare Other | Admitting: Cardiology

## 2011-04-01 ENCOUNTER — Ambulatory Visit (INDEPENDENT_AMBULATORY_CARE_PROVIDER_SITE_OTHER): Payer: Medicare Other | Admitting: *Deleted

## 2011-04-01 DIAGNOSIS — I4891 Unspecified atrial fibrillation: Secondary | ICD-10-CM

## 2011-04-01 DIAGNOSIS — Z7901 Long term (current) use of anticoagulants: Secondary | ICD-10-CM

## 2011-04-01 DIAGNOSIS — I6350 Cerebral infarction due to unspecified occlusion or stenosis of unspecified cerebral artery: Secondary | ICD-10-CM

## 2011-04-01 LAB — POCT INR: INR: 2.5

## 2011-04-14 ENCOUNTER — Other Ambulatory Visit: Payer: Self-pay | Admitting: Cardiology

## 2011-04-25 ENCOUNTER — Ambulatory Visit (INDEPENDENT_AMBULATORY_CARE_PROVIDER_SITE_OTHER): Payer: Medicare Other | Admitting: *Deleted

## 2011-04-25 ENCOUNTER — Ambulatory Visit (INDEPENDENT_AMBULATORY_CARE_PROVIDER_SITE_OTHER): Payer: Medicare Other | Admitting: Cardiology

## 2011-04-25 ENCOUNTER — Encounter: Payer: Self-pay | Admitting: Cardiology

## 2011-04-25 VITALS — BP 125/74 | HR 73 | Ht 75.0 in | Wt 282.0 lb

## 2011-04-25 DIAGNOSIS — Z7901 Long term (current) use of anticoagulants: Secondary | ICD-10-CM

## 2011-04-25 DIAGNOSIS — I4891 Unspecified atrial fibrillation: Secondary | ICD-10-CM

## 2011-04-25 DIAGNOSIS — R0602 Shortness of breath: Secondary | ICD-10-CM

## 2011-04-25 DIAGNOSIS — I6529 Occlusion and stenosis of unspecified carotid artery: Secondary | ICD-10-CM

## 2011-04-25 DIAGNOSIS — I509 Heart failure, unspecified: Secondary | ICD-10-CM

## 2011-04-25 DIAGNOSIS — I6350 Cerebral infarction due to unspecified occlusion or stenosis of unspecified cerebral artery: Secondary | ICD-10-CM

## 2011-04-25 DIAGNOSIS — I5032 Chronic diastolic (congestive) heart failure: Secondary | ICD-10-CM

## 2011-04-25 LAB — POCT INR: INR: 3

## 2011-04-25 MED ORDER — AZITHROMYCIN 250 MG PO TABS: ORAL_TABLET | ORAL | Status: AC

## 2011-04-25 NOTE — Patient Instructions (Signed)
Your physician wants you to follow-up in: 6 months. You will receive a reminder letter in the mail one-two months in advance. If you don't receive a letter, please call our office to schedule the follow-up appointment. Z-pack for 5 days as directed. Referral to Vascular surgery at [facility] Dr. Elaina Hoops.

## 2011-04-29 ENCOUNTER — Encounter: Payer: Medicare Other | Admitting: *Deleted

## 2011-05-16 ENCOUNTER — Other Ambulatory Visit: Payer: Self-pay | Admitting: Cardiology

## 2011-05-17 ENCOUNTER — Ambulatory Visit (INDEPENDENT_AMBULATORY_CARE_PROVIDER_SITE_OTHER): Payer: Medicare Other | Admitting: *Deleted

## 2011-05-17 DIAGNOSIS — I4891 Unspecified atrial fibrillation: Secondary | ICD-10-CM

## 2011-05-17 DIAGNOSIS — Z7901 Long term (current) use of anticoagulants: Secondary | ICD-10-CM

## 2011-05-17 DIAGNOSIS — I6350 Cerebral infarction due to unspecified occlusion or stenosis of unspecified cerebral artery: Secondary | ICD-10-CM

## 2011-06-13 NOTE — Assessment & Plan Note (Signed)
Euvolemic. 

## 2011-06-13 NOTE — Progress Notes (Signed)
Benjamin Bottoms, MD, [facility] ABIM Board Certified in Adult Cardiovascular Medicine,Internal Medicine and Critical Care Medicine    CC: routine F/U  HPI:  the patient is a 55 year old male with a history of atrial fibrillation [paroxysmal], left middle cerebral artery stroke and chronic bilateral carotid occlusions. The patient has a prior history of right hemothorax requiringVATS and drainage with decortication February 2010. The patient also has a history of alcohol abuse. He is a history of COPD and continues to smoke. The patient also continues to drink. He appears to have some degree of encephalopathy from chronic alcohol use. The patient reports a cough and congestion. He denies any chest pain or palpitations. The patient is on chronic coumadin      PMH: reviewed and listed in Problem List in Electronic Records (and see below) Past Medical History  Diagnosis Date  . Diabetes mellitus   . Hypertension   . Atrial fibrillation   . CVA (cerebral infarction)   . Alcohol abuse   . COPD (chronic obstructive pulmonary disease)   . Hyperlipidemia   . Hemothorax on right   . Diastolic CHF, acute    Past Surgical History  Procedure Date  . Hemothorax drainage 08/18/2008  . Thoracotomy     Right    Allergies/SH/FHX : available in Electronic Records for review  Medications: Current Outpatient Prescriptions  Medication Sig Dispense Refill  . aspirin 81 MG tablet Take 81 mg by mouth daily.        . Bilberry 60 MG CAPS Take 2 capsules by mouth daily.        . Chlorphen-Pseudoephed-APAP (CORICIDIN D PO) Take by mouth as needed.        . Cinnamon 500 MG TABS Take 2 tablets by mouth 2 (two) times daily.        . fish oil-omega-3 fatty acids 1000 MG capsule Take 2 capsules by mouth 2 (two) times daily.        . Fluticasone-Salmeterol (ADVAIR) 250-50 MCG/DOSE AEPB Inhale 1 puff into the lungs 2 (two) times daily.        . folic acid (FOLVITE) 1 MG tablet Take 1 mg by mouth daily.          . furosemide (LASIX) 20 MG tablet Take 20 mg by mouth daily.        Marland Kitchen guaiFENesin (MUCINEX) 600 MG 12 hr tablet Take 1,200 mg by mouth 2 (two) times daily.        Marland Kitchen lisinopril (PRINIVIL,ZESTRIL) 40 MG tablet TAKE 1 TABLET BY MOUTH DAILY  90 tablet  2  . metFORMIN (GLUCOPHAGE) 1000 MG tablet Take 1,000 mg by mouth 2 (two) times daily.        . Multiple Vitamin (MULTIVITAMIN) tablet Take 1 tablet by mouth daily.        Marland Kitchen NIFEdipine (PROCARDIA XL/ADALAT-CC) 30 MG 24 hr tablet Take 1 tablet (30 mg total) by mouth daily.  30 tablet  6  . NIFEdipine (PROCARDIA XL/ADALAT-CC) 90 MG 24 hr tablet TAKE 1 TABLET BY MOUTH ONCE A DAY  90 tablet  3  . omeprazole (PRILOSEC OTC) 20 MG tablet Take 20 mg by mouth daily.        . potassium chloride SA (K-DUR,KLOR-CON) 20 MEQ tablet Take 20 mEq by mouth daily.        . Potassium Gluconate 595 MG CAPS Take 2 capsules by mouth 2 (two) times daily.        . Pseudoephedrine-APAP-DM (DAYQUIL MULTI-SYMPTOM COLD/FLU PO) Take  by mouth as needed.        . sertraline (ZOLOFT) 50 MG tablet Take 50 mg by mouth daily.        . simvastatin (ZOCOR) 40 MG tablet Take 20 mg by mouth at bedtime.        Marland Kitchen spironolactone (ALDACTONE) 25 MG tablet TAKE 1/2 TABLET BY MOUTH DAILY  15 tablet  6  . tadalafil (CIALIS) 5 MG tablet Take 5 mg by mouth daily as needed.        . tiotropium (SPIRIVA) 18 MCG inhalation capsule Place 18 mcg into inhaler and inhale daily.        Marland Kitchen warfarin (COUMADIN) 5 MG tablet TAKE 1 TABLET BY MOUTH AS DIRECTED  75 tablet  2  . carvedilol (COREG) 25 MG tablet TAKE 1 TABLET BY MOUTH TWICE DAILY  60 tablet  6    ROS: No nausea or vomiting. No fever or chills.No melena or hematochezia.No bleeding.No claudication  Physical Exam: BP 125/74  Pulse 73  Ht 6\' 3"  (1.905 m)  Wt 282 lb (127.914 kg)  BMI 35.25 kg/m2 General: well- nourished. No distress HEENT: normal carotid upstroke. Normal JVP. No thyromegaly Cardiac: RRR, NL S1/S2. No pathologic murmurs Lungs:  clear BS bilaterally, rhonchi Abdomen, soft, non- tender Extremities. No edema. Normal distal pulses Skin: warm and dry Physologic ; normal affect.   12lead ECG:NSR. NSTT Limited bedside ECHO:N/A   Assessment and Plan

## 2011-06-13 NOTE — Assessment & Plan Note (Signed)
Reports cough and congestion - a Zpack was given.

## 2011-06-13 NOTE — Assessment & Plan Note (Signed)
Remains in NSR. Continue coumadin.

## 2011-06-14 ENCOUNTER — Ambulatory Visit (INDEPENDENT_AMBULATORY_CARE_PROVIDER_SITE_OTHER): Payer: Medicare Other | Admitting: *Deleted

## 2011-06-14 DIAGNOSIS — I4891 Unspecified atrial fibrillation: Secondary | ICD-10-CM

## 2011-06-14 DIAGNOSIS — Z7901 Long term (current) use of anticoagulants: Secondary | ICD-10-CM

## 2011-06-14 DIAGNOSIS — I6350 Cerebral infarction due to unspecified occlusion or stenosis of unspecified cerebral artery: Secondary | ICD-10-CM

## 2011-06-30 ENCOUNTER — Other Ambulatory Visit: Payer: Self-pay | Admitting: Cardiology

## 2011-07-12 ENCOUNTER — Ambulatory Visit (INDEPENDENT_AMBULATORY_CARE_PROVIDER_SITE_OTHER): Payer: Medicare Other | Admitting: *Deleted

## 2011-07-12 DIAGNOSIS — I6350 Cerebral infarction due to unspecified occlusion or stenosis of unspecified cerebral artery: Secondary | ICD-10-CM

## 2011-07-12 DIAGNOSIS — Z7901 Long term (current) use of anticoagulants: Secondary | ICD-10-CM

## 2011-07-12 DIAGNOSIS — I4891 Unspecified atrial fibrillation: Secondary | ICD-10-CM

## 2011-07-12 LAB — POCT INR: INR: 2.7

## 2011-08-08 ENCOUNTER — Other Ambulatory Visit: Payer: Self-pay | Admitting: Cardiology

## 2011-08-09 ENCOUNTER — Ambulatory Visit (INDEPENDENT_AMBULATORY_CARE_PROVIDER_SITE_OTHER): Payer: Medicare Other | Admitting: *Deleted

## 2011-08-09 DIAGNOSIS — Z7901 Long term (current) use of anticoagulants: Secondary | ICD-10-CM

## 2011-08-09 DIAGNOSIS — I6350 Cerebral infarction due to unspecified occlusion or stenosis of unspecified cerebral artery: Secondary | ICD-10-CM

## 2011-08-09 DIAGNOSIS — I4891 Unspecified atrial fibrillation: Secondary | ICD-10-CM

## 2011-08-09 LAB — POCT INR: INR: 3.3

## 2011-09-06 ENCOUNTER — Ambulatory Visit (INDEPENDENT_AMBULATORY_CARE_PROVIDER_SITE_OTHER): Payer: Medicare Other | Admitting: *Deleted

## 2011-09-06 DIAGNOSIS — I6350 Cerebral infarction due to unspecified occlusion or stenosis of unspecified cerebral artery: Secondary | ICD-10-CM

## 2011-09-06 DIAGNOSIS — Z7901 Long term (current) use of anticoagulants: Secondary | ICD-10-CM

## 2011-09-06 DIAGNOSIS — I4891 Unspecified atrial fibrillation: Secondary | ICD-10-CM

## 2011-09-27 ENCOUNTER — Ambulatory Visit (INDEPENDENT_AMBULATORY_CARE_PROVIDER_SITE_OTHER): Payer: Medicare Other | Admitting: *Deleted

## 2011-09-27 DIAGNOSIS — Z7901 Long term (current) use of anticoagulants: Secondary | ICD-10-CM

## 2011-09-27 DIAGNOSIS — I6350 Cerebral infarction due to unspecified occlusion or stenosis of unspecified cerebral artery: Secondary | ICD-10-CM

## 2011-09-27 DIAGNOSIS — I4891 Unspecified atrial fibrillation: Secondary | ICD-10-CM

## 2011-10-05 ENCOUNTER — Other Ambulatory Visit: Payer: Self-pay | Admitting: Cardiology

## 2011-10-28 ENCOUNTER — Ambulatory Visit (INDEPENDENT_AMBULATORY_CARE_PROVIDER_SITE_OTHER): Payer: Medicare Other | Admitting: Cardiology

## 2011-10-28 ENCOUNTER — Ambulatory Visit (INDEPENDENT_AMBULATORY_CARE_PROVIDER_SITE_OTHER): Payer: Medicare Other | Admitting: *Deleted

## 2011-10-28 ENCOUNTER — Encounter: Payer: Self-pay | Admitting: Cardiology

## 2011-10-28 VITALS — BP 138/76 | HR 80 | Ht 75.0 in | Wt 279.0 lb

## 2011-10-28 DIAGNOSIS — Z7901 Long term (current) use of anticoagulants: Secondary | ICD-10-CM

## 2011-10-28 DIAGNOSIS — I4891 Unspecified atrial fibrillation: Secondary | ICD-10-CM

## 2011-10-28 DIAGNOSIS — I6350 Cerebral infarction due to unspecified occlusion or stenosis of unspecified cerebral artery: Secondary | ICD-10-CM

## 2011-10-28 DIAGNOSIS — I779 Disorder of arteries and arterioles, unspecified: Secondary | ICD-10-CM

## 2011-10-28 DIAGNOSIS — R0602 Shortness of breath: Secondary | ICD-10-CM

## 2011-10-28 DIAGNOSIS — I5032 Chronic diastolic (congestive) heart failure: Secondary | ICD-10-CM

## 2011-10-28 DIAGNOSIS — I509 Heart failure, unspecified: Secondary | ICD-10-CM

## 2011-10-28 MED ORDER — RIVAROXABAN 20 MG PO TABS: 1.0000 | ORAL_TABLET | Freq: Every day | ORAL | Status: DC

## 2011-10-28 NOTE — Patient Instructions (Signed)
Your physician recommends that you go to the [facility] for lab work today for [employer]. Hold Warfarin x 3 days, then begin Xarelto 20mg  daily DO NOT BEGIN XARELTO UNTIL YOU HEAR FROM LAB ABOVE Echo Carotid Doppler If the results of your test are normal or stable, you will receive a letter.  If they are abnormal, the nurse will contact you by phone. Follow up in  3 months

## 2011-11-10 ENCOUNTER — Other Ambulatory Visit: Payer: Medicare Other

## 2011-11-13 NOTE — Assessment & Plan Note (Signed)
Discontinue warfarin and start dabigatran as outlined above

## 2011-11-13 NOTE — Assessment & Plan Note (Signed)
The patient remains currently in sinus rhythm.  He has a prior history of stroke.  He is requesting to be switched from Coumadin to dabigatran.  We discussed the risks and benefits of the latter medication and we'll plan to proceed with dabigatran heater 15 mg per 20 mg depending on his blood results in regards to his EGFR

## 2011-11-13 NOTE — Assessment & Plan Note (Addendum)
Patient has chronic shortness of breath.  However this appears to be fairly stable.  He has underlying lung disease.  He does not appear to be in heart failure.  We plan on echocardiogram for followup of his LV function.

## 2011-11-13 NOTE — Assessment & Plan Note (Signed)
Patient has history of left middle cerebral artery stroke with no residual deficits.  There is apparently history of 100% bilateral carotid occlusion but he has soft bilateral carotid bruits.  Plan followup carotid artery study.
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
## 2011-11-13 NOTE — Assessment & Plan Note (Signed)
No evidence of volume overload continue current medical regimen.

## 2011-11-16 ENCOUNTER — Other Ambulatory Visit: Payer: Self-pay

## 2011-11-16 ENCOUNTER — Other Ambulatory Visit (INDEPENDENT_AMBULATORY_CARE_PROVIDER_SITE_OTHER): Payer: Medicare Other

## 2011-11-16 ENCOUNTER — Ambulatory Visit: Payer: Self-pay | Admitting: *Deleted

## 2011-11-16 DIAGNOSIS — Z7901 Long term (current) use of anticoagulants: Secondary | ICD-10-CM

## 2011-11-16 DIAGNOSIS — I5032 Chronic diastolic (congestive) heart failure: Secondary | ICD-10-CM

## 2011-11-16 DIAGNOSIS — I4891 Unspecified atrial fibrillation: Secondary | ICD-10-CM

## 2011-11-16 DIAGNOSIS — R0602 Shortness of breath: Secondary | ICD-10-CM

## 2011-11-16 DIAGNOSIS — I480 Paroxysmal atrial fibrillation: Secondary | ICD-10-CM

## 2011-11-16 DIAGNOSIS — I6350 Cerebral infarction due to unspecified occlusion or stenosis of unspecified cerebral artery: Secondary | ICD-10-CM

## 2011-11-18 ENCOUNTER — Encounter: Payer: Self-pay | Admitting: *Deleted

## 2011-11-23 ENCOUNTER — Other Ambulatory Visit: Payer: Medicare Other

## 2011-12-05 ENCOUNTER — Other Ambulatory Visit: Payer: Self-pay | Admitting: Cardiology

## 2012-01-31 ENCOUNTER — Ambulatory Visit: Payer: Medicare Other | Admitting: Cardiology

## 2012-02-27 ENCOUNTER — Other Ambulatory Visit: Payer: Self-pay | Admitting: Cardiology

## 2012-03-06 ENCOUNTER — Other Ambulatory Visit: Payer: Self-pay | Admitting: Cardiology

## 2012-03-27 ENCOUNTER — Other Ambulatory Visit: Payer: Self-pay | Admitting: Cardiology

## 2012-05-23 ENCOUNTER — Ambulatory Visit: Payer: Medicare Other | Admitting: Cardiology

## 2012-07-09 ENCOUNTER — Other Ambulatory Visit: Payer: Self-pay | Admitting: Cardiology

## 2012-07-09 ENCOUNTER — Other Ambulatory Visit: Payer: Self-pay | Admitting: Physician Assistant

## 2012-07-10 ENCOUNTER — Encounter: Payer: Self-pay | Admitting: Cardiology

## 2012-07-12 ENCOUNTER — Other Ambulatory Visit: Payer: Self-pay | Admitting: *Deleted

## 2012-07-12 ENCOUNTER — Ambulatory Visit (INDEPENDENT_AMBULATORY_CARE_PROVIDER_SITE_OTHER): Payer: Medicare Other | Admitting: Cardiology

## 2012-07-12 ENCOUNTER — Encounter: Payer: Self-pay | Admitting: Cardiology

## 2012-07-12 VITALS — BP 125/71 | HR 67 | Ht 75.0 in | Wt 272.0 lb

## 2012-07-12 DIAGNOSIS — I480 Paroxysmal atrial fibrillation: Secondary | ICD-10-CM

## 2012-07-12 DIAGNOSIS — I4891 Unspecified atrial fibrillation: Secondary | ICD-10-CM

## 2012-07-12 DIAGNOSIS — I6529 Occlusion and stenosis of unspecified carotid artery: Secondary | ICD-10-CM | POA: Insufficient documentation

## 2012-07-12 DIAGNOSIS — E785 Hyperlipidemia, unspecified: Secondary | ICD-10-CM

## 2012-07-12 DIAGNOSIS — I10 Essential (primary) hypertension: Secondary | ICD-10-CM

## 2012-07-12 NOTE — Assessment & Plan Note (Signed)
Bilateral occluded ICAs with patent external carotids by Dopplers from last February.

## 2012-07-12 NOTE — Assessment & Plan Note (Signed)
Symptomatically stable on current regimen including Coreg, nifedipine, and Xarelto. ECG shows sinus rhythm today. Followup BMET to reassess GFR.

## 2012-07-12 NOTE — Assessment & Plan Note (Signed)
Blood pressure control is good today. No changes made. 

## 2012-07-12 NOTE — Assessment & Plan Note (Signed)
Followed by Dr. Willaim Bane, on statin therapy.

## 2012-07-12 NOTE — Patient Instructions (Addendum)
Your physician recommends that you schedule a follow-up appointment in: 6 months. You will receive a reminder letter in the mail in about 4 months reminding you to call and schedule your appointment. If you don't receive this letter, please contact our office.  Your physician recommends that you continue on your current medications as directed. Please refer to the Current Medication list given to you today.  Your physician recommends that you lab work done at Dr. Eston Esters office for a BMET. Please have their office send results to Dr. Diona Browner fax #[phone].
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
## 2012-07-25 ENCOUNTER — Telehealth: Payer: Self-pay | Admitting: *Deleted

## 2012-07-25 NOTE — Telephone Encounter (Signed)
Message copied by Eustace Moore on Wed Jul 25, 2012  1:14 PM ------      Message from: MCDOWELL, Illene Bolus      Created: Wed Jul 25, 2012 12:23 PM       Reviewed. Normal renal function and GFR, normal hemoglobin, LDL 77. No change to current regimen.

## 2012-07-25 NOTE — Telephone Encounter (Signed)
Patient informed via message machine. 

## 2012-08-06 ENCOUNTER — Other Ambulatory Visit: Payer: Self-pay | Admitting: Cardiology

## 2012-08-06 MED ORDER — CARVEDILOL 25 MG PO TABS: 25.0000 mg | ORAL_TABLET | Freq: Two times a day (BID) | ORAL | Status: DC

## 2012-08-06 NOTE — Telephone Encounter (Signed)
Pharmacist called to confirm that patient was on both doses of nifedipine. Nurse reviewed chart and patient has been on both doses for some time now. Pharmacist informed.

## 2012-08-06 NOTE — Telephone Encounter (Signed)
[name] with [facility] called and needs clarification of script from Dr. Diona Browner.  NIFEdipine is the medication he has question about.

## 2012-08-30 ENCOUNTER — Encounter (INDEPENDENT_AMBULATORY_CARE_PROVIDER_SITE_OTHER): Payer: Medicare Other

## 2012-08-30 DIAGNOSIS — I6529 Occlusion and stenosis of unspecified carotid artery: Secondary | ICD-10-CM

## 2012-09-10 ENCOUNTER — Other Ambulatory Visit: Payer: Self-pay | Admitting: Cardiology

## 2012-09-24 ENCOUNTER — Other Ambulatory Visit: Payer: Self-pay | Admitting: Cardiology

## 2012-11-06 ENCOUNTER — Other Ambulatory Visit: Payer: Self-pay | Admitting: Cardiology

## 2013-01-28 ENCOUNTER — Other Ambulatory Visit: Payer: Self-pay | Admitting: Cardiology

## 2013-02-06 ENCOUNTER — Other Ambulatory Visit: Payer: Self-pay | Admitting: Cardiology

## 2013-02-06 NOTE — Telephone Encounter (Signed)
Medication sent via escribe.  

## 2013-04-02 ENCOUNTER — Ambulatory Visit: Payer: Medicare Other | Admitting: Cardiology

## 2013-04-03 ENCOUNTER — Ambulatory Visit: Payer: Medicare Other | Admitting: Cardiology

## 2013-04-10 ENCOUNTER — Other Ambulatory Visit: Payer: Self-pay | Admitting: Cardiology

## 2013-04-18 ENCOUNTER — Other Ambulatory Visit: Payer: Self-pay | Admitting: *Deleted

## 2013-04-18 MED ORDER — SPIRONOLACTONE 25 MG PO TABS: 12.5000 mg | ORAL_TABLET | Freq: Every day | ORAL | Status: DC

## 2013-05-07 ENCOUNTER — Ambulatory Visit (INDEPENDENT_AMBULATORY_CARE_PROVIDER_SITE_OTHER): Payer: Medicare Other | Admitting: Cardiology

## 2013-05-07 ENCOUNTER — Encounter: Payer: Self-pay | Admitting: Cardiology

## 2013-05-07 VITALS — BP 148/75 | HR 62 | Ht 75.0 in | Wt 286.0 lb

## 2013-05-07 DIAGNOSIS — I6523 Occlusion and stenosis of bilateral carotid arteries: Secondary | ICD-10-CM

## 2013-05-07 DIAGNOSIS — I6529 Occlusion and stenosis of unspecified carotid artery: Secondary | ICD-10-CM

## 2013-05-07 DIAGNOSIS — I5032 Chronic diastolic (congestive) heart failure: Secondary | ICD-10-CM

## 2013-05-07 DIAGNOSIS — I4891 Unspecified atrial fibrillation: Secondary | ICD-10-CM

## 2013-05-07 DIAGNOSIS — E785 Hyperlipidemia, unspecified: Secondary | ICD-10-CM

## 2013-05-07 NOTE — Progress Notes (Signed)
Clinical Summary [name] is a 57 y.o.male last seen in January. He is here for regular followup. Reports no chest pain or palpitations. No new neurological symptoms. Indicates that he has been tolerating Xarelto without major bleeding problems.  Lab work from January showed BUN 15, creatinine 0.8, normal AST and ALT, hemoglobin 14.1, cholesterol 163, triglycerides 277, HDL 31, LDL 77.   He will be seeing Dr. Willaim Bane later this week for a routine check.  Carotid Dopplers from February of this year showed chronic bilateral ICA occlusions with progressive bilateral ECA stenosis based on velocities. Followup study was recommended in one year per Dr. Fabio Bering report.   Allergies  Allergen Reactions  . Hydrocodone-Acetaminophen     REACTION: itch  . Lorazepam     REACTION: PATIENT WIFE REPORTS SEVERE REACTION UNDESCRIBABLE  . Oxycodone-Acetaminophen     REACTION: [facility]    Current Outpatient Prescriptions  Medication Sig Dispense Refill  . albuterol (PROVENTIL) (2.5 MG/3ML) 0.083% nebulizer solution Take 2.5 mg by nebulization daily.       . carvedilol (COREG) 25 MG tablet TAKE 1 TABLET BY MOUTH TWICE DAILY  60 tablet  1  . Cinnamon 500 MG capsule Take 2,000 mg by mouth 2 (two) times daily.      . diazepam (VALIUM) 2 MG tablet Take 2 mg by mouth at bedtime as needed.       . fish oil-omega-3 fatty acids 1000 MG capsule Take 1 g by mouth 2 (two) times daily.      . furosemide (LASIX) 20 MG tablet Take 20 mg by mouth daily.        Marland Kitchen glimepiride (AMARYL) 4 MG tablet Take 4 mg by mouth daily with breakfast.      . guaiFENesin (MUCINEX) 600 MG 12 hr tablet Take 1,200 mg by mouth 2 (two) times daily as needed.       Marland Kitchen lisinopril (PRINIVIL,ZESTRIL) 40 MG tablet TAKE 1 TABLET BY MOUTH ONCE DAILY  90 tablet  3  . metFORMIN (GLUCOPHAGE) 1000 MG tablet Take 1,000 mg by mouth daily with breakfast.       . Multiple Vitamin (MULTIVITAMIN) tablet Take 1 tablet by mouth daily.        Marland Kitchen NIFEDICAL XL  30 MG 24 hr tablet TAKE 1 TABLET BY MOUTH ONCE DAILY  30 tablet  6  . NIFEdipine (PROCARDIA XL/ADALAT-CC) 90 MG 24 hr tablet TAKE 1 TABLET BY MOUTH DAILY  30 tablet  6  . omeprazole (PRILOSEC OTC) 20 MG tablet Take 20 mg by mouth daily.        . Potassium 99 MG TABS Take 3 tablets by mouth 2 (two) times daily.      . potassium chloride SA (K-DUR,KLOR-CON) 20 MEQ tablet Take 20 mEq by mouth daily.        . predniSONE (DELTASONE) 20 MG tablet Take 20 mg by mouth as needed.      Marland Kitchen PROAIR HFA 108 (90 BASE) MCG/ACT inhaler Inhale 1 puff into the lungs every 6 (six) hours as needed.       . Pseudoephedrine-APAP-DM (DAYQUIL MULTI-SYMPTOM COLD/FLU PO) Take by mouth as needed.        . sertraline (ZOLOFT) 50 MG tablet Take 100 mg by mouth daily.       . simvastatin (ZOCOR) 40 MG tablet Take 20 mg by mouth at bedtime.        Marland Kitchen spironolactone (ALDACTONE) 25 MG tablet Take 0.5 tablets (12.5 mg total) by mouth  daily.  15 tablet  6  . tadalafil (CIALIS) 5 MG tablet Take 5 mg by mouth daily as needed.        . tiotropium (SPIRIVA) 18 MCG inhalation capsule Place 18 mcg into inhaler and inhale as needed.       Carlena Hurl 20 MG TABS TAKE 1 TABLET BY MOUTH DAILY  30 tablet  6   No current facility-administered medications for this visit.    Past Medical History  Diagnosis Date  . Type 2 diabetes mellitus   . Essential hypertension, benign   . Atrial fibrillation   . CVA (cerebral infarction)     Left MCA distribution  . Alcohol abuse   . COPD (chronic obstructive pulmonary disease)   . Hyperlipidemia   . Hemothorax on right   . Chronic diastolic heart failure     Social History [name] reports that he quit smoking about 4 years ago. His smoking use included Cigarettes. He has a 122.5 pack-year smoking history. He has never used smokeless tobacco. [name] reports that he drinks alcohol.  Review of Systems Had a laceration of his left lower leg that was sutured. Also chronic leg edema. No  other changes.  Physical Examination Filed Vitals:   05/07/13 1510  BP: 148/75  Pulse: 62   Filed Weights   05/07/13 1510  Weight: 286 lb (129.729 kg)    Obese. No acute distress.  HEENT: Conjunctiva and lids normal, oropharynx clear.  Neck: Supple, no elevated JVP, soft carotid bruits, no thyromegaly.  Lungs: Clear to auscultation, nonlabored breathing at rest.  Cardiac: Regular rate and rhythm, no S3 or significant systolic murmur.  Abdomen: Soft, nontender, protuberant, bowel sounds present.  Extremities: Mild, chronic appearing leg edema, healing laceration on left, distal pulses 1-2+.  Skin: Warm and dry.  Musculoskeletal: No kyphosis.  Neuropsychiatric: Alert and oriented x3, affect grossly appropriate.    Problem List and Plan   Paroxysmal atrial fibrillation Continues on Coreg and Xarelto. To see Dr. Willaim Bane soon for routine check, suggest followup BMET and CBC.  Carotid artery occlusion Bilateral chronic ICA occlusions with ECA disease as well. Followup carotid Doppler being arranged for February. On statin and anticoagulant. Has history of stroke previously.  DIASTOLIC HEART FAILURE, CHRONIC Weight is up, probably not all wall fluid based, but did recommend that he consider increasing Lasix to 40 mg daily.  HYPERLIPIDEMIA-MIXED LDL was 77 earlier in the year. He continues on statin therapy.    Jonelle Sidle, M.D., F.A.C.C.

## 2013-05-07 NOTE — Assessment & Plan Note (Signed)
Continues on Coreg and Xarelto. To see Dr. Willaim Bane soon for routine check, suggest followup BMET and CBC.

## 2013-05-07 NOTE — Assessment & Plan Note (Signed)
Weight is up, probably not all wall fluid based, but did recommend that he consider increasing Lasix to 40 mg daily.

## 2013-05-07 NOTE — Assessment & Plan Note (Signed)
Bilateral chronic ICA occlusions with ECA disease as well. Followup carotid Doppler being arranged for February. On statin and anticoagulant. Has history of stroke previously.

## 2013-05-07 NOTE — Assessment & Plan Note (Signed)
LDL was 77 earlier in the year. He continues on statin therapy.

## 2013-05-07 NOTE — Patient Instructions (Signed)
Your physician has requested that you have a carotid duplex. This test is an ultrasound of the carotid arteries in your neck. It looks at blood flow through these arteries that supply the brain with blood. Allow one hour for this exam. There are no restrictions or special instructions. - Due February 2015 Continue all current medications. Your physician wants you to follow up in: 6 months.  You will receive a reminder letter in the mail one-two months in advance.  If you don't receive a letter, please call our office to schedule the follow up appointment

## 2013-06-03 ENCOUNTER — Telehealth: Payer: Self-pay

## 2013-06-03 MED ORDER — CARVEDILOL 25 MG PO TABS: 25.0000 mg | ORAL_TABLET | Freq: Two times a day (BID) | ORAL | Status: DC

## 2013-06-03 NOTE — Telephone Encounter (Signed)
Done

## 2013-06-03 NOTE — Telephone Encounter (Signed)
Received fax refill request  Rx # [id number] Medication:  Carvedilol 25 mg tablet Qty 60 Sig:  Take 1 tablet by mouth twice daily Physician:  Diona Browner

## 2013-06-10 ENCOUNTER — Telehealth: Payer: Self-pay | Admitting: Cardiology

## 2013-06-10 NOTE — Telephone Encounter (Signed)
Received fax refill request  Rx # [id number] Medication:  Carvedilol 25 mg tablet  Qty 60 Sig:  Take one tablet by mouth twice daily Physician:  Diona Browner

## 2013-06-10 NOTE — Telephone Encounter (Signed)
Called pharmacy and confirmed that rx profiled that was sent 06/03/13. Staff at pharmacy says rx is there and she would refill for the patient.

## 2013-08-22 ENCOUNTER — Encounter (INDEPENDENT_AMBULATORY_CARE_PROVIDER_SITE_OTHER): Payer: PRIVATE HEALTH INSURANCE

## 2013-08-22 DIAGNOSIS — I6529 Occlusion and stenosis of unspecified carotid artery: Secondary | ICD-10-CM

## 2013-08-22 DIAGNOSIS — I6523 Occlusion and stenosis of bilateral carotid arteries: Secondary | ICD-10-CM

## 2013-08-26 ENCOUNTER — Telehealth: Payer: Self-pay | Admitting: *Deleted

## 2013-08-26 NOTE — Telephone Encounter (Signed)
Message copied by Eustace MooreANDERSON, Jaxie Racanelli M on Mon Aug 26, 2013  8:46 AM ------      Message from: MCDOWELL, Illene BolusSAMUEL G      Created: Fri Aug 23, 2013 11:40 AM       Stable findings with known chronic occlusion of the bilateral ICAs. Continue medical therapy. ------

## 2013-08-26 NOTE — Telephone Encounter (Signed)
Patient's wife informed

## 2013-09-11 ENCOUNTER — Other Ambulatory Visit: Payer: Self-pay | Admitting: Cardiology

## 2013-09-11 MED ORDER — NIFEDIPINE ER OSMOTIC RELEASE 90 MG PO TB24: ORAL_TABLET | ORAL | Status: DC

## 2013-09-11 MED ORDER — RIVAROXABAN 20 MG PO TABS: ORAL_TABLET | ORAL | Status: DC

## 2013-11-19 ENCOUNTER — Other Ambulatory Visit: Payer: Self-pay | Admitting: *Deleted

## 2013-11-19 MED ORDER — SPIRONOLACTONE 25 MG PO TABS: 12.5000 mg | ORAL_TABLET | Freq: Every day | ORAL | Status: DC

## 2013-11-20 ENCOUNTER — Other Ambulatory Visit: Payer: Self-pay | Admitting: Cardiology

## 2013-11-20 MED ORDER — SPIRONOLACTONE 25 MG PO TABS: 12.5000 mg | ORAL_TABLET | Freq: Every day | ORAL | Status: DC

## 2013-12-23 ENCOUNTER — Other Ambulatory Visit: Payer: Self-pay | Admitting: *Deleted

## 2013-12-23 MED ORDER — CARVEDILOL 25 MG PO TABS: 25.0000 mg | ORAL_TABLET | Freq: Two times a day (BID) | ORAL | Status: DC

## 2014-02-06 ENCOUNTER — Telehealth: Payer: Self-pay | Admitting: *Deleted

## 2014-02-07 NOTE — Telephone Encounter (Signed)
Left message on 8/6 to schedule overdue follow up

## 2014-02-10 ENCOUNTER — Other Ambulatory Visit: Payer: Self-pay | Admitting: *Deleted

## 2014-02-10 MED ORDER — LISINOPRIL 40 MG PO TABS: ORAL_TABLET | ORAL | Status: DC

## 2014-02-18 ENCOUNTER — Other Ambulatory Visit: Payer: Self-pay | Admitting: *Deleted

## 2014-02-18 MED ORDER — CARVEDILOL 25 MG PO TABS
25.0000 mg | ORAL_TABLET | Freq: Two times a day (BID) | ORAL | Status: DC
Start: 2014-02-18 — End: 2014-03-03

## 2014-02-18 NOTE — Telephone Encounter (Signed)
Left message on nurse's vm to call office r/e patient needing office visit.

## 2014-03-03 ENCOUNTER — Other Ambulatory Visit: Payer: Self-pay | Admitting: *Deleted

## 2014-03-03 MED ORDER — CARVEDILOL 25 MG PO TABS: 25.0000 mg | ORAL_TABLET | Freq: Two times a day (BID) | ORAL | Status: DC

## 2014-03-04 ENCOUNTER — Other Ambulatory Visit: Payer: Self-pay | Admitting: *Deleted

## 2014-03-04 MED ORDER — CARVEDILOL 25 MG PO TABS: 25.0000 mg | ORAL_TABLET | Freq: Two times a day (BID) | ORAL | Status: DC

## 2014-04-03 ENCOUNTER — Other Ambulatory Visit: Payer: Self-pay | Admitting: *Deleted

## 2014-04-03 MED ORDER — CARVEDILOL 25 MG PO TABS: 25.0000 mg | ORAL_TABLET | Freq: Two times a day (BID) | ORAL | Status: DC

## 2014-04-08 ENCOUNTER — Other Ambulatory Visit: Payer: Self-pay | Admitting: *Deleted

## 2014-04-08 MED ORDER — CARVEDILOL 25 MG PO TABS: 25.0000 mg | ORAL_TABLET | Freq: Two times a day (BID) | ORAL | Status: DC

## 2014-04-22 ENCOUNTER — Other Ambulatory Visit: Payer: Self-pay | Admitting: *Deleted

## 2014-04-22 MED ORDER — SPIRONOLACTONE 25 MG PO TABS: 12.5000 mg | ORAL_TABLET | Freq: Every day | ORAL | Status: DC

## 2014-05-05 ENCOUNTER — Other Ambulatory Visit: Payer: Self-pay | Admitting: *Deleted

## 2014-05-05 MED ORDER — NIFEDIPINE ER OSMOTIC RELEASE 90 MG PO TB24: ORAL_TABLET | ORAL | Status: DC

## 2014-05-07 ENCOUNTER — Encounter: Payer: Self-pay | Admitting: Cardiology

## 2014-05-07 ENCOUNTER — Ambulatory Visit (INDEPENDENT_AMBULATORY_CARE_PROVIDER_SITE_OTHER): Payer: PRIVATE HEALTH INSURANCE | Admitting: Cardiology

## 2014-05-07 ENCOUNTER — Other Ambulatory Visit: Payer: Self-pay | Admitting: *Deleted

## 2014-05-07 VITALS — BP 128/73 | HR 65 | Ht 75.0 in | Wt 296.0 lb

## 2014-05-07 DIAGNOSIS — I5032 Chronic diastolic (congestive) heart failure: Secondary | ICD-10-CM

## 2014-05-07 DIAGNOSIS — I10 Essential (primary) hypertension: Secondary | ICD-10-CM

## 2014-05-07 DIAGNOSIS — I4819 Other persistent atrial fibrillation: Secondary | ICD-10-CM

## 2014-05-07 DIAGNOSIS — I6529 Occlusion and stenosis of unspecified carotid artery: Secondary | ICD-10-CM

## 2014-05-07 DIAGNOSIS — I481 Persistent atrial fibrillation: Secondary | ICD-10-CM

## 2014-05-07 MED ORDER — RIVAROXABAN 20 MG PO TABS: ORAL_TABLET | ORAL | Status: DC

## 2014-05-07 MED ORDER — CARVEDILOL 25 MG PO TABS: 25.0000 mg | ORAL_TABLET | Freq: Two times a day (BID) | ORAL | Status: DC

## 2014-05-07 MED ORDER — NIFEDIPINE ER OSMOTIC RELEASE 30 MG PO TB24: 30.0000 mg | ORAL_TABLET | Freq: Every day | ORAL | Status: DC

## 2014-05-07 MED ORDER — NIFEDIPINE ER OSMOTIC RELEASE 90 MG PO TB24: ORAL_TABLET | ORAL | Status: DC

## 2014-05-07 MED ORDER — LISINOPRIL 40 MG PO TABS: ORAL_TABLET | ORAL | Status: DC

## 2014-05-07 MED ORDER — SPIRONOLACTONE 25 MG PO TABS: 12.5000 mg | ORAL_TABLET | Freq: Every day | ORAL | Status: DC

## 2014-05-07 NOTE — Assessment & Plan Note (Signed)
Blood pressure is fairly well controlled today. We will request most recent lab work from Dr. Willaim BanePark.

## 2014-05-07 NOTE — Progress Notes (Signed)
Reason for visit: Atrial fibrillation, diastolic heart failure  Clinical Summary [name] is a 58 y.o.male last seen in November 2014. He continues to follow with Dr. Willaim Avila on a regular basis. Denies any major change in overall cardiac status, no palpitations or chest pain with atrial fibrillation. He is maintained on Xarelto which is followed by Dr. Willaim Avila, and also Coreg with good heart rate control. ECG today shows atrial fibrillation with controlled ventricular response.  His weight is up 10 pounds compared to last year. It sounds like he had an interval exacerbation of volume overload in the setting of ongoing alcohol abuse which required intensification of his diuretics by Dr. Willaim Avila. He states that he is doing much better, has cut back his alcohol somewhat.  Follow-up carotid Dopplers from February of this year showed known chronic occlusion of the bilateral ICAs.  Echocardiogram from May 2013 showed mild LVH with LVEF 55-60%, mild left atrial enlargement, no major valvular abnormalities.  Allergies  Allergen Reactions  . Hydrocodone-Acetaminophen     REACTION: itch  . Lorazepam     REACTION: PATIENT WIFE REPORTS SEVERE REACTION UNDESCRIBABLE  . Oxycodone-Acetaminophen     [facility]    Current Outpatient Prescriptions  Medication Sig Dispense Refill  . albuterol (PROVENTIL) (2.5 MG/3ML) 0.083% nebulizer solution Take 2.5 mg by nebulization daily.     Marland Kitchen. b complex vitamins tablet Take 1 tablet by mouth daily.    Marland Kitchen. CALCIUM-MAGNESIUM-ZINC PO Take 1 tablet by mouth 2 (two) times daily.    . Cinnamon 500 MG capsule Take 1,000 mg by mouth 2 (two) times daily.     . diazepam (VALIUM) 2 MG tablet Take 2 mg by mouth at bedtime as needed.     . fish oil-omega-3 fatty acids 1000 MG capsule Take 1 g by mouth 2 (two) times daily.    . furosemide (LASIX) 20 MG tablet Take 20-60 mg by mouth daily.     Marland Kitchen. glimepiride (AMARYL) 4 MG tablet Take 4 mg by mouth daily with breakfast.    .  guaiFENesin (MUCINEX) 600 MG 12 hr tablet Take 1,200 mg by mouth 2 (two) times daily as needed.     . loratadine (CLARITIN) 10 MG tablet Take 10 mg by mouth daily.    . metFORMIN (GLUCOPHAGE) 1000 MG tablet Take 1,000 mg by mouth daily with breakfast.     . milk thistle 175 MG tablet Take 175 mg by mouth daily.    . Multiple Vitamin (MULTIVITAMIN) tablet Take 1 tablet by mouth daily.      Marland Kitchen. omeprazole (PRILOSEC OTC) 20 MG tablet Take 20 mg by mouth daily.      Marland Kitchen. oxybutynin (DITROPAN) 5 MG tablet Take 5 mg by mouth.    . Potassium 99 MG TABS Take 3 tablets by mouth 2 (two) times daily.    . potassium chloride SA (K-DUR,KLOR-CON) 20 MEQ tablet Take 20 mEq by mouth daily.      . predniSONE (DELTASONE) 20 MG tablet Take 20 mg by mouth as needed.    Marland Kitchen. PROAIR HFA 108 (90 BASE) MCG/ACT inhaler Inhale 1 puff into the lungs every 6 (six) hours as needed.     . Pseudoephedrine-APAP-DM (DAYQUIL MULTI-SYMPTOM COLD/FLU PO) Take by mouth as needed.      . sertraline (ZOLOFT) 50 MG tablet Take 100 mg by mouth daily.     . simvastatin (ZOCOR) 40 MG tablet Take 20 mg by mouth at bedtime.      . tadalafil (  CIALIS) 5 MG tablet Take 5 mg by mouth daily as needed.      . tiotropium (SPIRIVA) 18 MCG inhalation capsule Place 18 mcg into inhaler and inhale as needed.     . carvedilol (COREG) 25 MG tablet Take 1 tablet (25 mg total) by mouth 2 (two) times daily. 180 tablet 3  . lisinopril (PRINIVIL,ZESTRIL) 40 MG tablet TAKE 1 TABLET BY MOUTH ONCE DAILY 90 tablet 3  . NIFEdipine (NIFEDICAL XL) 30 MG 24 hr tablet Take 1 tablet (30 mg total) by mouth daily. 90 tablet 3  . NIFEdipine (PROCARDIA XL/ADALAT-CC) 90 MG 24 hr tablet TAKE 1 TABLET BY MOUTH DAILY 90 tablet 3  . rivaroxaban (XARELTO) 20 MG TABS tablet TAKE 1 TABLET BY MOUTH DAILY 30 tablet 6  . spironolactone (ALDACTONE) 25 MG tablet Take 0.5 tablets (12.5 mg total) by mouth daily. 45 tablet 3   No current facility-administered medications for this visit.     Past Medical History  Diagnosis Date  . Type 2 diabetes mellitus   . Essential hypertension, benign   . Atrial fibrillation   . CVA (cerebral infarction)     Left MCA distribution  . Alcohol abuse   . COPD (chronic obstructive pulmonary disease)   . Hyperlipidemia   . Hemothorax on right   . Chronic diastolic heart failure     Social History [name] reports that he quit smoking about 5 years ago. His smoking use included Cigarettes. He started smoking about 51 years ago. He has a 122.5 pack-year smoking history. He has never used smokeless tobacco. [name] reports that he drinks alcohol.  Review of Systems Complete review of systems negative except as otherwise outlined in the clinical summary.  Physical Examination Filed Vitals:   05/07/14 1525  BP: 128/73  Pulse: 65   Filed Weights   05/07/14 1525  Weight: 296 lb (134.265 kg)    Obese. No acute distress.  HEENT: Conjunctiva and lids normal, oropharynx clear.  Neck: Supple, no elevated JVP, soft carotid bruits, no thyromegaly.  Lungs: Clear to auscultation, nonlabored breathing at rest.  Cardiac: Irregularly irregular, no S3 or significant systolic murmur.  Abdomen: Soft, nontender, protuberant, bowel sounds present.  Extremities: Mild, chronic appearing leg edema, distal pulses 1+.  Skin: Warm and dry.  Musculoskeletal: No kyphosis.  Neuropsychiatric: Alert and oriented x3, speaks slowly, affect grossly appropriate.    Problem List and Plan   Persistent atrial fibrillation Plan is to continue strategy of heart rate control and anticoagulation at this point. Suspect will be unlikely for him to maintain sinus rhythm with his comorbidities.  Carotid artery occlusion Bilateral occlusive disease. Follow carotid Dopplers for February 2016.  Essential hypertension, benign Blood pressure is fairly well controlled today. We will request most recent lab work from Dr. Willaim Avila.    Benjamin SidleSamuel G. Avila,  M.D., F.A.C.C.

## 2014-05-07 NOTE — Assessment & Plan Note (Signed)
Plan is to continue strategy of heart rate control and anticoagulation at this point. Suspect will be unlikely for him to maintain sinus rhythm with his comorbidities.

## 2014-05-07 NOTE — Assessment & Plan Note (Signed)
Bilateral occlusive disease. Follow carotid Dopplers for February 2016.

## 2014-05-07 NOTE — Patient Instructions (Signed)
Your physician recommends that you schedule a follow-up appointment in: 6 months. You will receive a reminder letter in the mail in about 4 months reminding you to call and schedule your appointment. If you don't receive this letter, please contact our office. Your physician recommends that you continue on your current medications as directed. Please refer to the Current Medication list given to you today. Your physician has requested that you have a carotid duplex in February 2016. This test is an ultrasound of the carotid arteries in your neck. It looks at blood flow through these arteries that supply the brain with blood. Allow one hour for this exam. There are no restrictions or special instructions.

## 2014-05-12 ENCOUNTER — Other Ambulatory Visit: Payer: Self-pay | Admitting: Cardiology

## 2014-06-18 ENCOUNTER — Other Ambulatory Visit: Payer: Self-pay | Admitting: Cardiology

## 2014-12-12 ENCOUNTER — Other Ambulatory Visit: Payer: Self-pay | Admitting: Cardiology

## 2015-03-21 ENCOUNTER — Other Ambulatory Visit: Payer: Self-pay | Admitting: Cardiology

## 2015-04-19 ENCOUNTER — Other Ambulatory Visit: Payer: Self-pay | Admitting: Cardiology

## 2015-04-20 NOTE — Telephone Encounter (Signed)
It looks like an office visit is needed, patient was seen one year ago. Can provide limited supply.

## 2015-04-20 NOTE — Telephone Encounter (Signed)
Please advise on refill as patient was last given 15 tablets and has still failed to schedule an appointment. Thanks, MI

## 2015-04-28 ENCOUNTER — Other Ambulatory Visit: Payer: Self-pay | Admitting: Cardiology

## 2015-04-28 ENCOUNTER — Other Ambulatory Visit: Payer: Self-pay

## 2015-04-28 MED ORDER — SPIRONOLACTONE 25 MG PO TABS: 12.5000 mg | ORAL_TABLET | Freq: Every day | ORAL | Status: DC

## 2015-05-03 ENCOUNTER — Other Ambulatory Visit: Payer: Self-pay | Admitting: Cardiology

## 2015-05-04 ENCOUNTER — Telehealth: Payer: Self-pay

## 2015-05-04 NOTE — Telephone Encounter (Signed)
Patients pharmacy requesting refill for Carvedilol 25 mg.  I cannot find this in his chart on a current med list.  I only found it in the medication history. Please advise. Thank you

## 2015-05-05 MED ORDER — CARVEDILOL 25 MG PO TABS: 25.0000 mg | ORAL_TABLET | Freq: Two times a day (BID) | ORAL | Status: DC

## 2015-05-05 NOTE — Addendum Note (Signed)
Addended by: Eustace MooreANDERSON, Ousmane Seeman M on: 05/05/2015 10:44 AM   Modules accepted: Orders, Medications

## 2015-05-05 NOTE — Telephone Encounter (Signed)
The patient's last office note from November 2015 as well as a complete medication list are found in EPIC. I am forwarding this to [facility] to address.

## 2015-05-21 ENCOUNTER — Other Ambulatory Visit: Payer: Self-pay | Admitting: Cardiology

## 2015-07-01 ENCOUNTER — Other Ambulatory Visit: Payer: Self-pay | Admitting: Cardiology

## 2015-07-15 ENCOUNTER — Encounter: Payer: Self-pay | Admitting: Cardiology

## 2015-07-15 ENCOUNTER — Encounter: Payer: Self-pay | Admitting: *Deleted

## 2015-07-15 ENCOUNTER — Ambulatory Visit (INDEPENDENT_AMBULATORY_CARE_PROVIDER_SITE_OTHER): Payer: Medicare HMO | Admitting: Cardiology

## 2015-07-15 VITALS — BP 152/80 | HR 78 | Ht 75.0 in | Wt 290.0 lb

## 2015-07-15 DIAGNOSIS — I482 Chronic atrial fibrillation, unspecified: Secondary | ICD-10-CM

## 2015-07-15 DIAGNOSIS — I6523 Occlusion and stenosis of bilateral carotid arteries: Secondary | ICD-10-CM | POA: Diagnosis not present

## 2015-07-15 DIAGNOSIS — I5032 Chronic diastolic (congestive) heart failure: Secondary | ICD-10-CM

## 2015-07-15 DIAGNOSIS — I10 Essential (primary) hypertension: Secondary | ICD-10-CM

## 2015-07-15 DIAGNOSIS — E782 Mixed hyperlipidemia: Secondary | ICD-10-CM

## 2015-07-15 NOTE — Patient Instructions (Signed)
Your physician recommends that you continue on your current medications as directed. Please refer to the Current Medication list given to you today. Your physician has requested that you have an echocardiogram. Echocardiography is a painless test that uses sound waves to create images of your heart. It provides your doctor with information about the size and shape of your heart and how well your heart's chambers and valves are working. This procedure takes approximately one hour. There are no restrictions for this procedure. Your physician has requested that you have a carotid duplex. This test is an ultrasound of the carotid arteries in your neck. It looks at blood flow through these arteries that supply the brain with blood. Allow one hour for this exam. There are no restrictions or special instructions. Your physician recommends that you schedule a follow-up appointment in: 6 months. You will receive a reminder letter in the mail in about 4 months reminding you to call and schedule your appointment. If you don't receive this letter, please contact our office. 

## 2015-07-15 NOTE — Progress Notes (Signed)
Cardiology Office Note  Date: 07/15/2015   ID: Marjorie Smolder Boykins, [place] 1956-02-09, MRN [id number]  PCP: Ardyth Man, MD  Primary Cardiologist: Nona Dell, MD   Chief Complaint  Patient presents with  . Atrial Fibrillation  . Diastolic heart failure    History of Present Illness: [name] is a 60 y.o. male last seen in November 2015. He presents for a routine visit. Since last evaluation he has continued to follow regularly with Dr. Willaim Bane. 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 He has chronic dyspnea on exertion which is multifactorial, reports no major decline over the last year. His weight is also been relatively stable, he remains morbidly obese. He does not report any exertional angina symptoms or palpitations.  He has a known history of chronic bilateral ICA occlusions with severe stenosis of the ECAs bilaterally. He is overdue for follow-up carotid Dopplers. We discussed this today. He does not report any new, focal neurological symptoms.  Medications are reviewed below. His cardiac regimen includes Xarelto, Procardia XL, Aldactone, Lasix, potassium supplements, and Coreg. He reports compliance. We plan to obtain most recent lab work from Dr. Willaim Bane for review.   Last echocardiogram was in 2013 revealing LVEF 55-60% at that time.  Past Medical History  Diagnosis Date  . Type 2 diabetes mellitus (HCC)   . Essential hypertension, benign   . Atrial fibrillation (HCC)   . CVA (cerebral infarction)     Left MCA distribution  . Alcohol abuse   . COPD (chronic obstructive pulmonary disease) (HCC)   . Hyperlipidemia   . Hemothorax on right   . Chronic diastolic heart failure [facility])     Past Surgical History  Procedure Laterality Date  . Hemothorax drainage  08/18/2008  . Thoracotomy      Right    Current Outpatient Prescriptions  Medication Sig Dispense Refill  . b complex vitamins tablet Take 1 tablet by mouth daily.    . carvedilol  (COREG) 25 MG tablet Take 1 tablet (25 mg total) by mouth 2 (two) times daily. 14 tablet 0  . Cinnamon 500 MG capsule Take 1,000 mg by mouth 2 (two) times daily.     . diazepam (VALIUM) 2 MG tablet Take 2 mg by mouth at bedtime as needed.     . fish oil-omega-3 fatty acids 1000 MG capsule Take 1 g by mouth 2 (two) times daily.    . furosemide (LASIX) 20 MG tablet Take 20-60 mg by mouth daily.     Marland Kitchen glimepiride (AMARYL) 4 MG tablet Take 4 mg by mouth daily with breakfast.    . guaiFENesin (MUCINEX) 600 MG 12 hr tablet Take 1,200 mg by mouth 2 (two) times daily as needed.     Marland Kitchen lisinopril (PRINIVIL,ZESTRIL) 40 MG tablet TAKE 1 TABLET BY MOUTH ONCE DAILY 30 tablet 1  . loratadine (CLARITIN) 10 MG tablet Take 10 mg by mouth daily.    . metFORMIN (GLUCOPHAGE) 1000 MG tablet Take 1,000 mg by mouth daily with breakfast.     . Multiple Vitamin (MULTIVITAMIN) tablet Take 1 tablet by mouth daily.      Marland Kitchen NIFEdipine (NIFEDICAL XL) 30 MG 24 hr tablet Take 1 tablet (30 mg total) by mouth daily. 90 tablet 3  . NIFEdipine (PROCARDIA XL/ADALAT-CC) 90 MG 24 hr tablet TAKE 1 TABLET BY MOUTH DAILY 90 tablet 3  . omeprazole (PRILOSEC OTC) 20 MG tablet Take 20 mg by mouth daily.      . Potassium  99 MG TABS Take 3 tablets by mouth 2 (two) times daily.    . potassium chloride SA (K-DUR,KLOR-CON) 20 MEQ tablet Take 20 mEq by mouth daily.      . predniSONE (DELTASONE) 20 MG tablet Take 20 mg by mouth as needed.    Marland Kitchen PROAIR HFA 108 (90 BASE) MCG/ACT inhaler Inhale 1 puff into the lungs every 6 (six) hours as needed.     . rivaroxaban (XARELTO) 20 MG TABS tablet TAKE 1 TABLET BY MOUTH DAILY 30 tablet 6  . sertraline (ZOLOFT) 50 MG tablet Take 100 mg by mouth daily.     . simvastatin (ZOCOR) 40 MG tablet Take 20 mg by mouth at bedtime.      Marland Kitchen spironolactone (ALDACTONE) 25 MG tablet Take 0.5 tablets (12.5 mg total) by mouth daily. 7 tablet 0  . tadalafil (CIALIS) 5 MG tablet Take 5 mg by mouth daily as needed.       No  current facility-administered medications for this visit.   Allergies:  Hydrocodone-acetaminophen; Lorazepam; and Oxycodone-acetaminophen   Social History: The patient  reports that he quit smoking about 7 years ago. His smoking use included Cigarettes. He started smoking about 53 years ago. He has a 122.5 pack-year smoking history. He has never used smokeless tobacco. He reports that he drinks alcohol. He reports that he does not use illicit drugs.   ROS:  Please see the history of present illness. Otherwise, complete review of systems is positive for chronic dyspnea, hoarseness.  All other systems are reviewed and negative.   Physical Exam: VS:  BP 152/80 mmHg  Pulse 78  Ht 6\' 3"  (1.905 m)  Wt 290 lb (131.543 kg)  BMI 36.25 kg/m2  SpO2 99%, BMI Body mass index is 36.25 kg/(m^2).  Wt Readings from Last 3 Encounters:  07/15/15 290 lb (131.543 kg)  05/07/14 296 lb (134.265 kg)  05/07/13 286 lb (129.729 kg)    Obese male, appears comfortable at rest.  HEENT: Conjunctiva and lids normal, oropharynx clear.  Neck: Supple, no elevated JVP, soft carotid bruits, no thyromegaly.  Lungs: Clear to auscultation, nonlabored breathing at rest.  Cardiac: Irregularly irregular, no S3 or significant systolic murmur.  Abdomen: Soft, nontender, protuberant, bowel sounds present.  Extremities: Mild, chronic appearing leg edema, distal pulses 1+.  Skin: Warm and dry.  Musculoskeletal: No kyphosis.  Neuropsychiatric: Alert and oriented x3, speaks slowly, affect grossly appropriate.   ECG: ECG is not ordered today.  Other Studies Reviewed Today:  Echocardiogram 11/16/2011: Study Conclusions  - Left ventricle: The cavity size was normal. Wall thickness was increased in a pattern of mild LVH. There was mild concentric hypertrophy. Systolic function was normal. The estimated ejection fraction was in the range of 55% to 60%. Wall motion was normal; there were no regional  wall motion abnormalities. - Aortic valve: Valve area: 1.98cm^2(VTI). Valve area: 1.88cm^2 (Vmax). - Left atrium: The atrium was mildly dilated.  Assessment and Plan:  1. Chronic atrial fibrillation. Heart rate is reasonably well controlled today on examination. He is not aware of any palpitations. Current medications include Coreg for heart rate control and also Xarelto. He denies any significant bleeding problems. When do obtain most recent lab work from Dr. Willaim Bane for review.  2. Chronic diastolic heart failure, last echocardiogram was in 2013. Follow-up study will be arranged to reassess systolic and diastolic function. He remains on diuretics in the form of Lasix and Aldactone.  3. Severe carotid artery disease, specifically chronic occlusions of bilateral  ICAs with severe ECA stenosis. Follow-up carotid Dopplers will be obtained. No focal neurological symptoms.  4. Hyperlipidemia, on Zocor. Requesting most recent lab work from Dr. Willaim BanePark.  5. Essential hypertension, blood pressure is elevated today. Discussed weight loss and sodium restriction.  Current medicines were reviewed with the patient today.   Orders Placed This Encounter  Procedures  . EKG 12-Lead  . ECHOCARDIOGRAM COMPLETE    Disposition: FU with me in 6 months.   Signed, Jonelle SidleSamuel G. Yazen Rosko, MD, [facility] 07/15/2015 4:00 PM    [facility] [address], [place], [place] [address] Phone: [phone]; Fax: [phone]

## 2015-07-23 ENCOUNTER — Ambulatory Visit (INDEPENDENT_AMBULATORY_CARE_PROVIDER_SITE_OTHER): Payer: Medicare HMO

## 2015-07-23 ENCOUNTER — Ambulatory Visit: Payer: Medicare HMO

## 2015-07-23 ENCOUNTER — Other Ambulatory Visit: Payer: Self-pay

## 2015-07-23 DIAGNOSIS — I10 Essential (primary) hypertension: Secondary | ICD-10-CM | POA: Diagnosis not present

## 2015-07-23 DIAGNOSIS — I6523 Occlusion and stenosis of bilateral carotid arteries: Secondary | ICD-10-CM

## 2015-07-24 ENCOUNTER — Other Ambulatory Visit: Payer: Self-pay | Admitting: Cardiology

## 2015-07-27 ENCOUNTER — Other Ambulatory Visit: Payer: Self-pay | Admitting: *Deleted

## 2015-07-27 MED ORDER — LISINOPRIL 40 MG PO TABS: 40.0000 mg | ORAL_TABLET | Freq: Every day | ORAL | Status: DC

## 2015-07-28 ENCOUNTER — Telehealth: Payer: Self-pay | Admitting: *Deleted

## 2015-07-28 NOTE — Telephone Encounter (Signed)
-----   Message from Jonelle Sidle, MD sent at 07/27/2015 10:12 AM EST ----- Reviewed report. Overall stable findings. Chronic bilateral ICA occlusions, >50% bilateral ECA stenosis, normal subclavian arteries bilaterally, and patent vertebral arteries with antegrade flow. Anticipate follow-up study in one year.

## 2015-07-28 NOTE — Telephone Encounter (Signed)
-----   Message from Jonelle Sidle, MD sent at 07/24/2015  5:09 PM EST ----- Reviewed study. LVEF remains normal range. Unable to further assess diastolic function. Plan will be to continue current medical regimen.

## 2015-07-28 NOTE — Telephone Encounter (Signed)
Wife informed

## 2015-09-11 ENCOUNTER — Other Ambulatory Visit: Payer: Self-pay | Admitting: *Deleted

## 2015-09-11 MED ORDER — SPIRONOLACTONE 25 MG PO TABS: 12.5000 mg | ORAL_TABLET | Freq: Every day | ORAL | Status: DC

## 2015-09-11 MED ORDER — CARVEDILOL 25 MG PO TABS: 25.0000 mg | ORAL_TABLET | Freq: Two times a day (BID) | ORAL | Status: DC

## 2016-02-07 ENCOUNTER — Other Ambulatory Visit: Payer: Self-pay | Admitting: Cardiology

## 2016-03-07 ENCOUNTER — Other Ambulatory Visit: Payer: Self-pay | Admitting: Cardiology

## 2016-06-08 ENCOUNTER — Telehealth: Payer: Self-pay | Admitting: Cardiology

## 2016-06-08 NOTE — Telephone Encounter (Signed)
Numerous attempts to contact patient with recall letters with no success.   New [10]     [System] 10/03/2015 11:03 PM Notification Sent [20]   Megan SalonVicky T Slaughter [[id number]][[id number]] 11/16/2015 3:47 PM Notification Sent [20]   Donata DuffVicky T Slaughter [[id number]][[id number]] 03/23/2016 8:23 AM Notification Sent [20]   Megan SalonVicky T Slaughter [[id number]][[id number]] 06/08/2016 12:10 PM Notification Sent

## 2016-06-21 ENCOUNTER — Other Ambulatory Visit: Payer: Self-pay | Admitting: Cardiology

## 2016-07-02 ENCOUNTER — Other Ambulatory Visit: Payer: Self-pay | Admitting: Cardiology

## 2016-07-19 ENCOUNTER — Other Ambulatory Visit: Payer: Self-pay | Admitting: Cardiology

## 2016-09-24 ENCOUNTER — Other Ambulatory Visit: Payer: Self-pay | Admitting: Cardiology

## 2016-10-08 ENCOUNTER — Other Ambulatory Visit: Payer: Self-pay | Admitting: Cardiology

## 2016-11-12 ENCOUNTER — Other Ambulatory Visit: Payer: Self-pay | Admitting: Cardiology

## 2016-11-29 ENCOUNTER — Other Ambulatory Visit: Payer: Self-pay | Admitting: Cardiology

## 2016-11-30 ENCOUNTER — Encounter: Payer: Self-pay | Admitting: *Deleted

## 2016-11-30 NOTE — Progress Notes (Signed)
Cardiology Office Note  Date: 12/01/2016   ID: [name], [name] October 26, 1955, MRN [id number]  PCP: Ranae Palms, MD  Primary Cardiologist: Rozann Lesches, MD   Chief Complaint  Patient presents with  . Atrial Fibrillation    History of Present Illness: [name] is a 62 y.o. male last seen in January 2017. He presents overdue for a follow-up visit with his wife today. States that he has been struggling with a chronic left foot fracture that occurred after a fall last year. He has had several casts and is wearing a protective boot at this time. He is not sure whether he will pursue surgery or not as it may actually reduce his mobility.  He continues on Xarelto for stroke prophylaxis with atrial fibrillation. He does not report any bleeding problems. He has not had follow-up lab work. He continues to follow with Dr. Hampton Abbot for primary care.  I reviewed his medications. Cardiac regimen includes Coreg, Lasix, lisinopril, potassium supplements, nifedipine, Zocor, Aldactone, and Xarelto.  He is due for follow-up carotid Dopplers.  I personally reviewed his ECG today which shows rate-controlled atrial fibrillation with low voltage in the limb leads.  Past Medical History:  Diagnosis Date  . Alcohol abuse   . Atrial fibrillation ([place])   . Chronic diastolic heart failure ([place])   . COPD (chronic obstructive pulmonary disease) ([place])   . CVA (cerebral infarction)    Left MCA distribution  . Essential hypertension, benign   . Hemothorax on right   . Hyperlipidemia   . Type 2 diabetes mellitus ([place])     Past Surgical History:  Procedure Laterality Date  . Hemothorax drainage  08/18/2008  . THORACOTOMY     Right    Current Outpatient Prescriptions  Medication Sig Dispense Refill  . ALPRAZolam (XANAX XR) 1 MG 24 hr tablet Take 1 mg by mouth daily.    Marland Kitchen b complex vitamins tablet Take 1 tablet by mouth daily.    . carvedilol (COREG) 25 MG tablet TAKE 1 TABLET BY  MOUTH TWICE A DAY **NEEDS APPT** 15 tablet 0  . Cinnamon 500 MG capsule Take 1,000 mg by mouth 2 (two) times daily.     . fish oil-omega-3 fatty acids 1000 MG capsule Take 1 g by mouth 2 (two) times daily.    . furosemide (LASIX) 20 MG tablet Take 20-60 mg by mouth daily.     Marland Kitchen glimepiride (AMARYL) 4 MG tablet Take 4 mg by mouth daily with breakfast.    . guaiFENesin (MUCINEX) 600 MG 12 hr tablet Take 1,200 mg by mouth 2 (two) times daily as needed.     . liraglutide (VICTOZA) 18 MG/3ML SOPN Inject 1.2 mg into the skin daily.    Marland Kitchen lisinopril (PRINIVIL,ZESTRIL) 40 MG tablet TAKE 1 TABLET ONCE DAILY 15 tablet 0  . loratadine (CLARITIN) 10 MG tablet Take 10 mg by mouth daily.    . Multiple Vitamin (MULTIVITAMIN) tablet Take 1 tablet by mouth daily.      Marland Kitchen NIFEdipine (NIFEDICAL XL) 30 MG 24 hr tablet Take 1 tablet (30 mg total) by mouth daily. 90 tablet 3  . NIFEdipine (PROCARDIA XL/ADALAT-CC) 90 MG 24 hr tablet TAKE 1 TABLET BY MOUTH DAILY 90 tablet 1  . omeprazole (PRILOSEC OTC) 20 MG tablet Take 20 mg by mouth daily.      . Potassium 99 MG TABS Take 3 tablets by mouth 2 (two) times daily.    . potassium chloride SA (K-DUR,KLOR-CON)  20 MEQ tablet Take 20 mEq by mouth daily.      . predniSONE (DELTASONE) 20 MG tablet Take 20 mg by mouth as needed.    Marland Kitchen PROAIR HFA 108 (90 BASE) MCG/ACT inhaler Inhale 1 puff into the lungs every 6 (six) hours as needed.     . rivaroxaban (XARELTO) 20 MG TABS tablet TAKE 1 TABLET BY MOUTH DAILY 30 tablet 6  . sertraline (ZOLOFT) 50 MG tablet Take 100 mg by mouth daily.     . simvastatin (ZOCOR) 40 MG tablet Take 20 mg by mouth at bedtime.      Marland Kitchen spironolactone (ALDACTONE) 25 MG tablet TAKE 1/2 TABLET BY MOUTH DAILY **NEEDS APPT** 5 tablet 0  . tadalafil (CIALIS) 5 MG tablet Take 5 mg by mouth daily as needed.       No current facility-administered medications for this visit.    Allergies:  Hydrocodone-acetaminophen; Lorazepam; and Oxycodone-acetaminophen    Social History: The patient  reports that he quit smoking about 8 years ago. His smoking use included Cigarettes. He started smoking about 54 years ago. He has a 122.50 pack-year smoking history. He has never used smokeless tobacco. He reports that he drinks alcohol. He reports that he does not use drugs.   ROS:  Please see the history of present illness. Otherwise, complete review of systems is positive for chronic right-sided weakness.  All other systems are reviewed and negative.   Physical Exam: VS:  BP (!) 150/70   Pulse 72   Ht _0  (1.905 m)   Wt 282 lb 6.4 oz (128.1 kg)   SpO2 97%   BMI 35.30 kg/m , BMI Body mass index is 35.3 kg/m.  Wt Readings from Last 3 Encounters:  12/01/16 282 lb 6.4 oz (128.1 kg)  07/15/15 290 lb (131.5 kg)  05/07/14 296 lb (134.3 kg)    Obese male, appears comfortable at rest.  HEENT: Conjunctiva and lids normal, oropharynx clear.  Neck: Supple, no elevated JVP, soft carotid bruits, no thyromegaly.  Lungs: Clear to auscultation, nonlabored breathing at rest.  Cardiac: Irregularly irregular, no S3 or significant systolic murmur.  Abdomen: Soft, nontender, protuberant, bowel sounds present.  Extremities: Protective boot on the left foot. Skin: Warm and dry.  Musculoskeletal: No kyphosis.  Neuropsychiatric: Alert and oriented x3, speaks slowly, affect grossly appropriate.  ECG: I personally reviewed the tracing from 07/15/2015 which showed rate-controlled atrial fibrillation with nonspecific T-wave changes.  Other Studies Reviewed Today:  Echocardiogram 07/23/2015: Study Conclusions  - Left ventricle: The cavity size was normal. Wall thickness was   increased in a pattern of moderate LVH. Systolic function was   vigorous. The estimated ejection fraction was in the range of 65%   to 70%. Wall motion was normal; there were no regional wall   motion abnormalities. The study is not technically sufficient to   allow evaluation of LV  diastolic function. - Aortic valve: Mildly to moderately calcified annulus. Trileaflet;   mildly calcified leaflets. - Mitral valve: Calcified annulus. Mildly calcified leaflets .   There was trivial regurgitation. - Left atrium: The atrium was moderately to severely dilated. - Right atrium: The atrium was severely dilated. - Atrial septum: A patent foramen ovale cannot be excluded. - Tricuspid valve: There was trivial regurgitation. Peak RV-RA   gradient (S): 34 mm Hg. - Inferior vena cava: Unable to assess CVP. - Pericardium, extracardiac: A prominent pericardial fat pad was   present.  Impressions:  - Moderate LVH with LVEF 65-70%.  Indeterminate diastolic function   in the setting of atrial fibrillation. Moderate to severe left   atrial enlargement and severe right atrial enlargement. Moderate   MAC with mildly calcified mitral leaflets and trivial mitral   regurgitation. Sclerotic aortic valve without stenosis. Trivial   tricuspid regurgitation. RV-RA gradient 34 mmHg.  Carotid Dopplers 07/23/2015: Stable, chronic bilateral ICA occlusions with greater than 50% bilateral ECA stenoses, normal subclavian this, and patent vertebral arteries with antegrade flow.  Assessment and Plan:  1. Chronic atrial fibrillation. Continue current medications for heart rate control, he is asymptomatic in terms of palpitations. Also continues on Xarelto for stroke prophylaxis. Follow-up CBC and BMET.  2. Severe bilateral carotid artery disease, chronically occluded ICAs with severe ECA stenoses. He has previous history of stroke. He is on anticoagulant therapy and statin therapy. Follow-up carotid Dopplers.  3. Hyperlipidemia, continues on Zocor.  4. Essential hypertension, keep follow-up with Dr. Hampton Abbot. No changes made to current medications.  Current medicines were reviewed with the patient today.   Orders Placed This Encounter  Procedures  . EKG 12-Lead    Disposition: Follow-up in 6  months.  Signed, Satira Sark, MD, [facility] 12/01/2016 10:32 AM    [facility] [place], [place], [place] [address] Phone: [phone]; Fax: [phone]

## 2016-12-01 ENCOUNTER — Ambulatory Visit (INDEPENDENT_AMBULATORY_CARE_PROVIDER_SITE_OTHER): Payer: Medicare HMO | Admitting: Cardiology

## 2016-12-01 ENCOUNTER — Encounter: Payer: Self-pay | Admitting: Cardiology

## 2016-12-01 VITALS — BP 150/70 | HR 72 | Ht 75.0 in | Wt 282.4 lb

## 2016-12-01 DIAGNOSIS — I10 Essential (primary) hypertension: Secondary | ICD-10-CM

## 2016-12-01 DIAGNOSIS — I482 Chronic atrial fibrillation, unspecified: Secondary | ICD-10-CM

## 2016-12-01 DIAGNOSIS — I4819 Other persistent atrial fibrillation: Secondary | ICD-10-CM

## 2016-12-01 DIAGNOSIS — I481 Persistent atrial fibrillation: Secondary | ICD-10-CM | POA: Diagnosis not present

## 2016-12-01 DIAGNOSIS — E782 Mixed hyperlipidemia: Secondary | ICD-10-CM | POA: Diagnosis not present

## 2016-12-01 DIAGNOSIS — I6523 Occlusion and stenosis of bilateral carotid arteries: Secondary | ICD-10-CM

## 2016-12-01 MED ORDER — CARVEDILOL 25 MG PO TABS: ORAL_TABLET | ORAL | 6 refills | Status: DC

## 2016-12-01 MED ORDER — NIFEDIPINE ER OSMOTIC RELEASE 90 MG PO TB24: 90.0000 mg | ORAL_TABLET | Freq: Every day | ORAL | 3 refills | Status: DC

## 2016-12-01 NOTE — Patient Instructions (Signed)
Medication Instructions:  Your physician recommends that you continue on your current medications as directed. Please refer to the Current Medication list given to you today.  Labwork: CBC, BMET  Testing/Procedures: Your physician has requested that you have a carotid duplex. This test is an ultrasound of the carotid arteries in your neck. It looks at blood flow through these arteries that supply the brain with blood. Allow one hour for this exam. There are no restrictions or special instructions.  Follow-Up: Your physician wants you to follow-up in: 6 MONTHS WITH DR. MCDOWELL. You will receive a reminder letter in the mail two months in advance. If you don't receive a letter, please call our office to schedule the follow-up appointment.  Any Other Special Instructions Will Be Listed Below (If Applicable).  If you need a refill on your cardiac medications before your next appointment, please call your pharmacy.

## 2016-12-14 ENCOUNTER — Other Ambulatory Visit: Payer: Self-pay | Admitting: Cardiology

## 2016-12-14 MED ORDER — SPIRONOLACTONE 25 MG PO TABS: ORAL_TABLET | ORAL | 1 refills | Status: DC

## 2016-12-15 ENCOUNTER — Other Ambulatory Visit: Payer: Self-pay | Admitting: *Deleted

## 2016-12-15 MED ORDER — SPIRONOLACTONE 25 MG PO TABS: ORAL_TABLET | ORAL | 1 refills | Status: DC

## 2016-12-27 ENCOUNTER — Other Ambulatory Visit: Payer: Self-pay | Admitting: Cardiology

## 2017-05-22 ENCOUNTER — Other Ambulatory Visit: Payer: Self-pay | Admitting: *Deleted

## 2017-05-22 MED ORDER — CARVEDILOL 25 MG PO TABS: ORAL_TABLET | ORAL | 0 refills | Status: DC

## 2017-05-22 MED ORDER — LISINOPRIL 40 MG PO TABS: 40.0000 mg | ORAL_TABLET | Freq: Every day | ORAL | 0 refills | Status: DC

## 2017-08-18 ENCOUNTER — Other Ambulatory Visit: Payer: Self-pay | Admitting: Cardiology

## 2018-04-24 NOTE — Progress Notes (Deleted)
Cardiology Office Note  Date: 04/24/2018   ID: Trevone, [place] 1955/08/11, MRN [id number]  PCP: Ranae Palms, MD  Primary Cardiologist: Rozann Lesches, MD   No chief complaint on file.   History of Present Illness: [name] is a 62 y.o. male last seen in May 2018.  He presents overdue for follow-up.  Past Medical History:  Diagnosis Date  . Alcohol abuse   . Atrial fibrillation ([place])   . Chronic diastolic heart failure ([place])   . COPD (chronic obstructive pulmonary disease) ([place])   . CVA (cerebral infarction)    Left MCA distribution  . Essential hypertension, benign   . Hemothorax on right   . Hyperlipidemia   . Type 2 diabetes mellitus ([place])     Past Surgical History:  Procedure Laterality Date  . Hemothorax drainage  08/18/2008  . THORACOTOMY     Right    Current Outpatient Medications  Medication Sig Dispense Refill  . ALPRAZolam (XANAX XR) 1 MG 24 hr tablet Take 1 mg by mouth daily.    Marland Kitchen b complex vitamins tablet Take 1 tablet by mouth daily.    . carvedilol (COREG) 25 MG tablet TAKE 1 TABLET BY MOUTH TWICE A DAY. **APPT FOR REFILLS** 180 tablet 0  . Cinnamon 500 MG capsule Take 1,000 mg by mouth 2 (two) times daily.     . fish oil-omega-3 fatty acids 1000 MG capsule Take 1 g by mouth 2 (two) times daily.    . furosemide (LASIX) 20 MG tablet Take 20-60 mg by mouth daily.     Marland Kitchen glimepiride (AMARYL) 4 MG tablet Take 4 mg by mouth daily with breakfast.    . guaiFENesin (MUCINEX) 600 MG 12 hr tablet Take 1,200 mg by mouth 2 (two) times daily as needed.     . liraglutide (VICTOZA) 18 MG/3ML SOPN Inject 1.2 mg into the skin daily.    Marland Kitchen lisinopril (PRINIVIL,ZESTRIL) 40 MG tablet Take 1 tablet (40 mg total) daily by mouth. 90 tablet 0  . loratadine (CLARITIN) 10 MG tablet Take 10 mg by mouth daily.    . Multiple Vitamin (MULTIVITAMIN) tablet Take 1 tablet by mouth daily.      Marland Kitchen NIFEdipine (NIFEDICAL XL) 30 MG 24 hr tablet Take 1 tablet (30  mg total) by mouth daily. 90 tablet 3  . NIFEdipine (PROCARDIA XL/ADALAT-CC) 90 MG 24 hr tablet Take 1 tablet (90 mg total) by mouth daily. 90 tablet 3  . omeprazole (PRILOSEC OTC) 20 MG tablet Take 20 mg by mouth daily.      . Potassium 99 MG TABS Take 3 tablets by mouth 2 (two) times daily.    . potassium chloride SA (K-DUR,KLOR-CON) 20 MEQ tablet Take 20 mEq by mouth daily.      . predniSONE (DELTASONE) 20 MG tablet Take 20 mg by mouth as needed.    Marland Kitchen PROAIR HFA 108 (90 BASE) MCG/ACT inhaler Inhale 1 puff into the lungs every 6 (six) hours as needed.     . rivaroxaban (XARELTO) 20 MG TABS tablet TAKE 1 TABLET BY MOUTH DAILY 30 tablet 6  . sertraline (ZOLOFT) 50 MG tablet Take 100 mg by mouth daily.     . simvastatin (ZOCOR) 40 MG tablet Take 20 mg by mouth at bedtime.      Marland Kitchen spironolactone (ALDACTONE) 25 MG tablet TAKE 1/2 TABLET BY MOUTH DAILY 45 tablet 1  . tadalafil (CIALIS) 5 MG tablet Take 5 mg by mouth daily as  needed.       No current facility-administered medications for this visit.    Allergies:  Hydrocodone-acetaminophen; Lorazepam; and Oxycodone-acetaminophen   Social History: The patient  reports that he quit smoking about 9 years ago. His smoking use included cigarettes. He started smoking about 55 years ago. He has a 122.50 pack-year smoking history. He has never used smokeless tobacco. He reports that he drinks alcohol. He reports that he does not use drugs.   Family History: The patient's family history includes Aneurysm in his father; Diabetes in his mother and other; Hypertension in his other; Stroke in his sister.   ROS:  Please see the history of present illness. Otherwise, complete review of systems is positive for {NONE DEFAULTED:18576::"none"}.  All other systems are reviewed and negative.   Physical Exam: VS:  There were no vitals taken for this visit., BMI There is no height or weight on file to calculate BMI.  Wt Readings from Last 3 Encounters:  12/01/16 282  lb 6.4 oz (128.1 kg)  07/15/15 290 lb (131.5 kg)  05/07/14 296 lb (134.3 kg)    General: Patient appears comfortable at rest. HEENT: Conjunctiva and lids normal, oropharynx clear with moist mucosa. Neck: Supple, no elevated JVP or carotid bruits, no thyromegaly. Lungs: Clear to auscultation, nonlabored breathing at rest. Cardiac: Regular rate and rhythm, no S3 or significant systolic murmur, no pericardial rub. Abdomen: Soft, nontender, no hepatomegaly, bowel sounds present, no guarding or rebound. Extremities: No pitting edema, distal pulses 2+. Skin: Warm and dry. Musculoskeletal: No kyphosis. Neuropsychiatric: Alert and oriented x3, affect grossly appropriate.  ECG: I personally reviewed the tracing from 12/01/2016 which showed rate controlled atrial fibrillation with low voltage in the limb leads.  Recent Labwork:   Other Studies Reviewed Today:  Echocardiogram 07/23/2015: Study Conclusions  - Left ventricle: The cavity size was normal. Wall thickness was   increased in a pattern of moderate LVH. Systolic function was   vigorous. The estimated ejection fraction was in the range of 65%   to 70%. Wall motion was normal; there were no regional wall   motion abnormalities. The study is not technically sufficient to   allow evaluation of LV diastolic function. - Aortic valve: Mildly to moderately calcified annulus. Trileaflet;   mildly calcified leaflets. - Mitral valve: Calcified annulus. Mildly calcified leaflets .   There was trivial regurgitation. - Left atrium: The atrium was moderately to severely dilated. - Right atrium: The atrium was severely dilated. - Atrial septum: A patent foramen ovale cannot be excluded. - Tricuspid valve: There was trivial regurgitation. Peak RV-RA   gradient (S): 34 mm Hg. - Inferior vena cava: Unable to assess CVP. - Pericardium, extracardiac: A prominent pericardial fat pad was   present.  Impressions:  - Moderate LVH with LVEF 65-70%.  Indeterminate diastolic function   in the setting of atrial fibrillation. Moderate to severe left   atrial enlargement and severe right atrial enlargement. Moderate   MAC with mildly calcified mitral leaflets and trivial mitral   regurgitation. Sclerotic aortic valve without stenosis. Trivial   tricuspid regurgitation. RV-RA gradient 34 mmHg.  Carotid Dopplers 07/27/2015: Chronic bilateral ICA occlusion with greater than 50% bilateral ECA stenoses, normal subclavian arteries, patent vertebral arteries.  Assessment and Plan:    Current medicines were reviewed with the patient today.  No orders of the defined types were placed in this encounter.   Disposition:  Signed, Satira Sark, MD, [facility] 04/24/2018 10:28 AM    [facility] [place], [place], [place] [address] Phone: [phone]; Fax: [phone]

## 2018-04-25 ENCOUNTER — Ambulatory Visit: Payer: Medicare HMO | Admitting: Cardiology

## 2018-06-05 ENCOUNTER — Other Ambulatory Visit: Payer: Self-pay | Admitting: Cardiology

## 2018-06-22 ENCOUNTER — Other Ambulatory Visit: Payer: Self-pay | Admitting: Cardiology

## 2018-08-16 NOTE — Progress Notes (Deleted)
Cardiology Office Note  Date: 08/16/2018   ID: [name], DOB 02/15/1956, MRN [id number]  PCP: Ardyth ManPark, Chan M, MD  Primary Cardiologist: Nona DellSamuel Keni Elison, MD   No chief complaint on file.   History of Present Illness: [name] is a 63 y.o. male last seen in May 2018.  He presents overdue for follow-up.  Records indicate hospitalization and subsequent follow-up through [facility] last year.  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  Past Medical History:  Diagnosis Date  . Alcohol abuse   . Atrial fibrillation ([facility])   . Chronic diastolic heart failure ([facility])   . COPD (chronic obstructive pulmonary disease) ([facility])   . CVA (cerebral infarction)    Left MCA distribution  . Essential hypertension, benign   . Hemothorax on right   . Hyperlipidemia   . Type 2 diabetes mellitus ([facility])     Past Surgical History:  Procedure Laterality Date  . Hemothorax drainage  08/18/2008  . THORACOTOMY     Right    Current Outpatient Medications  Medication Sig Dispense Refill  . ALPRAZolam (XANAX XR) 1 MG 24 hr tablet Take 1 mg by mouth daily.    Marland Kitchen. b complex vitamins tablet Take 1 tablet by mouth daily.    . carvedilol (COREG) 25 MG tablet TAKE 1 TABLET BY MOUTH TWICE A DAY. **APPT FOR REFILLS** 14 tablet 0  . Cinnamon 500 MG capsule Take 1,000 mg by mouth 2 (two) times daily.     . fish oil-omega-3 fatty acids 1000 MG capsule Take 1 g by mouth 2 (two) times daily.    . furosemide (LASIX) 20 MG tablet Take 20-60 mg by mouth daily.     Marland Kitchen. glimepiride (AMARYL) 4 MG tablet Take 4 mg by mouth daily with breakfast.    . guaiFENesin (MUCINEX) 600 MG 12 hr tablet Take 1,200 mg by mouth 2 (two) times daily as needed.     . liraglutide (VICTOZA) 18 MG/3ML SOPN Inject 1.2 mg into the skin daily.    Marland Kitchen. lisinopril (PRINIVIL,ZESTRIL) 40 MG tablet Take 1  tablet (40 mg total) daily by mouth. 90 tablet 0  . loratadine (CLARITIN) 10 MG tablet Take 10 mg by mouth daily.    . Multiple Vitamin (MULTIVITAMIN) tablet Take 1 tablet by mouth daily.      Marland Kitchen. NIFEdipine (NIFEDICAL XL) 30 MG 24 hr tablet Take 1 tablet (30 mg total) by mouth daily. 90 tablet 3  . NIFEdipine (PROCARDIA XL/ADALAT-CC) 90 MG 24 hr tablet Take 1 tablet (90 mg total) by mouth daily. 90 tablet 3  . omeprazole (PRILOSEC OTC) 20 MG tablet Take 20 mg by mouth daily.      . Potassium 99 MG TABS Take 3 tablets by mouth 2 (two) times daily.    . potassium chloride SA (K-DUR,KLOR-CON) 20 MEQ tablet Take 20 mEq by mouth daily.      . predniSONE (DELTASONE) 20 MG tablet Take 20 mg by mouth as needed.    Marland Kitchen. PROAIR HFA 108 (90 BASE) MCG/ACT inhaler Inhale 1 puff into the lungs every 6 (six) hours as needed.     . rivaroxaban (XARELTO) 20 MG TABS tablet TAKE 1 TABLET BY MOUTH DAILY 30 tablet 6  . sertraline (ZOLOFT) 50 MG tablet Take 100 mg by mouth daily.     . simvastatin (ZOCOR) 40 MG tablet Take 20 mg  by mouth at bedtime.      Marland Kitchen spironolactone (ALDACTONE) 25 MG tablet TAKE 1/2 TABLET BY MOUTH DAILY 45 tablet 1  . tadalafil (CIALIS) 5 MG tablet Take 5 mg by mouth daily as needed.       No current facility-administered medications for this visit.    Allergies:  Hydrocodone-acetaminophen; Lorazepam; and Oxycodone-acetaminophen   Social History: The patient  reports that he quit smoking about 10 years ago. His smoking use included cigarettes. He started smoking about 56 years ago. He has a 122.50 pack-year smoking history. He has never used smokeless tobacco. He reports current alcohol use. He reports that he does not use drugs.   Family History: The patient's family history includes Aneurysm in his father; Diabetes in his mother and another family member; Hypertension in an other family member; Stroke in his sister.   ROS:  Please see the history of present illness. Otherwise, complete review  of systems is positive for {NONE DEFAULTED:18576::"none"}.  All other systems are reviewed and negative.   Physical Exam: VS:  There were no vitals taken for this visit., BMI There is no height or weight on file to calculate BMI.  Wt Readings from Last 3 Encounters:  12/01/16 282 lb 6.4 oz (128.1 kg)  07/15/15 290 lb (131.5 kg)  05/07/14 296 lb (134.3 kg)    General: Patient appears comfortable at rest. HEENT: Conjunctiva and lids normal, oropharynx clear with moist mucosa. Neck: Supple, no elevated JVP or carotid bruits, no thyromegaly. Lungs: Clear to auscultation, nonlabored breathing at rest. Cardiac: Regular rate and rhythm, no S3 or significant systolic murmur, no pericardial rub. Abdomen: Soft, nontender, no hepatomegaly, bowel sounds present, no guarding or rebound. Extremities: No pitting edema, distal pulses 2+. Skin: Warm and dry. Musculoskeletal: No kyphosis. Neuropsychiatric: Alert and oriented x3, affect grossly appropriate.  ECG: I personally reviewed the tracing from 12/01/2016 which showed rate controlled atrial fibrillation with low voltage in the limb leads.  Recent Labwork:  October 2019: Hemoglobin 7.4, platelets 245, potassium 4.2, BUN 18, creatinine 0.93  Other Studies Reviewed Today:  Echocardiogram 07/08/2017 [facility]): Summary  1. Overall left ventricular ejection fraction is estimated at 60 to 65%.  2. Moderately enlarged right ventricle.  3. Normal global left ventricular systolic function.  4. Basal inferolateral segment is abnormal as described in the body of the report.  5. Mild concentric left ventricular hypertrophy.  6. Severely reduced RV systolic function.  7. Moderately dilated left atrium.  8. Moderately dilated right atrium.  9. Mild mitral annular calcification. 10. McConnell's sign noted--consider acute pulmonary embolism. 11. Findings conveyed to Trauma team.  Assessment and Plan:    Current medicines were reviewed with the patient  today.  No orders of the defined types were placed in this encounter.   Disposition:  Signed, Jonelle Sidle, MD, [facility] 08/16/2018 8:31 AM    [facility] at [facility] [address] [place], [place], [place] [address] Phone: [phone]; Fax: [phone]

## 2018-08-17 ENCOUNTER — Telehealth: Payer: Self-pay | Admitting: Cardiology

## 2018-08-17 ENCOUNTER — Ambulatory Visit: Payer: Medicare HMO | Admitting: Cardiology

## 2018-08-17 NOTE — Telephone Encounter (Signed)
Noted.  I see that he was scheduled to see me in clinic today, overdue for follow-up.  Would not charge him as a no-show since he is hospitalized.

## 2018-08-17 NOTE — Telephone Encounter (Signed)
Patient's wife called stating that patient has been admitted to [facility] .She wanted to let Dr. Diona Browner know this.

## 2018-08-21 ENCOUNTER — Telehealth: Payer: Self-pay | Admitting: Cardiology

## 2018-08-21 ENCOUNTER — Encounter: Payer: Self-pay | Admitting: *Deleted

## 2018-08-21 NOTE — Telephone Encounter (Signed)
Recently discharged from [facility] for low hemoglobin a week ago last Thursday was given 8 units of blood. D/C this past Saturday 08/18/2018. Patient advised to continue same discharge plans for his medications that were given to him at discharge. Advised that his records would be requested and reviewed by his provider. Advised that his medication management would be discussed with his provider at his upcoming appointment on 08/24/2018. Verbalized understanding of plan.

## 2018-08-21 NOTE — Telephone Encounter (Signed)
Patient asking what he needs to do about medication  Recently in hospital again and they took him off some of blood thinners. He rescheduled his appointment he missed 08/17/2018

## 2018-08-22 ENCOUNTER — Encounter: Payer: Self-pay | Admitting: *Deleted

## 2018-08-23 NOTE — Progress Notes (Deleted)
Cardiology Office Note  Date: 08/23/2018   ID: [name], Fish Mar 16, 1956, MRN [id number]  PCP: Benjamin Man, MD  Primary Cardiologist: Benjamin Dell, MD   No chief complaint on file.   History of Present Illness: [name] is a medically complex 63 y.o. male last seen in May 2018.  He presents overdue for follow-up, missed his most recent visit due to hospitalization in [place].  I reviewed substantial records.  He was admitted with acute left arm weakness and ultimately a diagnosis of transient ischemic attack, no new acute stroke noted by brain MRI by report.  He also had evidence of life-threatening GI blood loss (hemoglobin to a low of 3.4) and underwent EGD which showed no acute bleeding source with recommendation to pursue colonoscopy as an outpatient.  He required 8 units of packed red cells and was ultimately taken off Xarelto long-term following discussion.  Imaging study showed bilateral internal carotid artery occlusion with reconstitution at the level of the supraclinoid ICA's via patent posterior communicating arteries.  Follow-up echocardiogram on February 5 reported LVEF 60 to 65% with mildly dilated right ventricle, moderate tricuspid regurgitation, and PASP estimated 58 mmHg.  Records indicate hospitalization and subsequent follow-up through the [facility] last year.  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  I personally reviewed his ECG from February 5 which showed rate controlled atrial fibrillation with nonspecific ST-T changes.  Past Medical History:  Diagnosis Date  . Alcohol abuse   . Atrial fibrillation (HCC)   . Chronic diastolic heart failure (HCC)   . COPD (chronic obstructive pulmonary disease) (HCC)   . CVA (cerebral infarction)    Left MCA distribution  . Essential hypertension, benign   . Hemothorax  on right   . Hyperlipidemia   . Type 2 diabetes mellitus (HCC)     Past Surgical History:  Procedure Laterality Date  . Hemothorax drainage  08/18/2008  . THORACOTOMY     Right    Current Outpatient Medications  Medication Sig Dispense Refill  . ALPRAZolam (XANAX XR) 1 MG 24 hr tablet Take 1 mg by mouth daily.    Marland Kitchen b complex vitamins tablet Take 1 tablet by mouth daily.    . carvedilol (COREG) 25 MG tablet TAKE 1 TABLET BY MOUTH TWICE A DAY. **APPT FOR REFILLS** 14 tablet 0  . Cinnamon 500 MG capsule Take 1,000 mg by mouth 2 (two) times daily.     . fish oil-omega-3 fatty acids 1000 MG capsule Take 1 g by mouth 2 (two) times daily.    . furosemide (LASIX) 20 MG tablet Take 20-60 mg by mouth daily.     Marland Kitchen glimepiride (AMARYL) 4 MG tablet Take 4 mg by mouth daily with breakfast.    . guaiFENesin (MUCINEX) 600 MG 12 hr tablet Take 1,200 mg by mouth 2 (two) times daily as needed.     . liraglutide (VICTOZA) 18 MG/3ML SOPN Inject 1.2 mg into the skin daily.    Marland Kitchen lisinopril (PRINIVIL,ZESTRIL) 40 MG tablet Take 1 tablet (40 mg total) daily by mouth. 90 tablet 0  . loratadine (CLARITIN) 10 MG tablet Take 10 mg by mouth daily.    . Multiple Vitamin (MULTIVITAMIN) tablet Take 1 tablet by mouth daily.      Marland Kitchen NIFEdipine (NIFEDICAL XL) 30 MG 24 hr tablet Take 1 tablet (30 mg total) by mouth daily. 90  tablet 3  . NIFEdipine (PROCARDIA XL/ADALAT-CC) 90 MG 24 hr tablet Take 1 tablet (90 mg total) by mouth daily. 90 tablet 3  . omeprazole (PRILOSEC OTC) 20 MG tablet Take 20 mg by mouth daily.      . Potassium 99 MG TABS Take 3 tablets by mouth 2 (two) times daily.    . potassium chloride SA (K-DUR,KLOR-CON) 20 MEQ tablet Take 20 mEq by mouth daily.      . predniSONE (DELTASONE) 20 MG tablet Take 20 mg by mouth as needed.    Marland Kitchen PROAIR HFA 108 (90 BASE) MCG/ACT inhaler Inhale 1 puff into the lungs every 6 (six) hours as needed.     . rivaroxaban (XARELTO) 20 MG TABS tablet TAKE 1 TABLET BY MOUTH DAILY 30  tablet 6  . sertraline (ZOLOFT) 50 MG tablet Take 100 mg by mouth daily.     . simvastatin (ZOCOR) 40 MG tablet Take 20 mg by mouth at bedtime.      Marland Kitchen spironolactone (ALDACTONE) 25 MG tablet TAKE 1/2 TABLET BY MOUTH DAILY 45 tablet 1  . tadalafil (CIALIS) 5 MG tablet Take 5 mg by mouth daily as needed.       No current facility-administered medications for this visit.    Allergies:  Hydrocodone-acetaminophen; Lorazepam; and Oxycodone-acetaminophen   Social History: The patient  reports that he quit smoking about 10 years ago. His smoking use included cigarettes. He started smoking about 56 years ago. He has a 122.50 pack-year smoking history. He has never used smokeless tobacco. He reports current alcohol use. He reports that he does not use drugs.   Family History: The patient's family history includes Aneurysm in his father; Diabetes in his mother and another family member; Hypertension in an other family member; Stroke in his sister.   ROS:  Please see the history of present illness. Otherwise, complete review of systems is positive for {NONE DEFAULTED:18576::"none"}.  All other systems are reviewed and negative.   Physical Exam: VS:  There were no vitals taken for this visit., BMI There is no height or weight on file to calculate BMI.  Wt Readings from Last 3 Encounters:  12/01/16 282 lb 6.4 oz (128.1 kg)  07/15/15 290 lb (131.5 kg)  05/07/14 296 lb (134.3 kg)    General: Patient appears comfortable at rest. HEENT: Conjunctiva and lids normal, oropharynx clear with moist mucosa. Neck: Supple, no elevated JVP or carotid bruits, no thyromegaly. Lungs: Clear to auscultation, nonlabored breathing at rest. Cardiac: Regular rate and rhythm, no S3 or significant systolic murmur, no pericardial rub. Abdomen: Soft, nontender, no hepatomegaly, bowel sounds present, no guarding or rebound. Extremities: No pitting edema, distal pulses 2+. Skin: Warm and dry. Musculoskeletal: No  kyphosis. Neuropsychiatric: Alert and oriented x3, affect grossly appropriate.  ECG: I personally reviewed the tracing from 12/01/2016 which showed rate controlled atrial fibrillation with low voltage in the limb leads.  Recent Labwork:  October 2019: Hemoglobin 7.4, platelets 245, potassium 4.2, BUN 18, creatinine 0.93 February 2020: Hemoglobin 8.9, platelets 127, BUN 17, creatinine 0.79, potassium 3.6, cholesterol 73, triglycerides 61, HDL 14, LDL 47  Other Studies Reviewed Today:  Echocardiogram 07/08/2017 [facility]): Summary  1. Overall left ventricular ejection fraction is estimated at 60 to 65%.  2. Moderately enlarged right ventricle.  3. Normal global left ventricular systolic function.  4. Basal inferolateral segment is abnormal as described in the body of the report.  5. Mild concentric left ventricular hypertrophy.  6. Severely reduced RV systolic function.  7. Moderately dilated left atrium.  8. Moderately dilated right atrium.  9. Mild mitral annular calcification. 10. McConnell's sign noted--consider acute pulmonary embolism. 11. Findings conveyed to Trauma team.  Assessment and Plan:    Current medicines were reviewed with the patient today.  No orders of the defined types were placed in this encounter.   Disposition:  Signed, Jonelle SidleSamuel G. , MD, [facility] ServicesFACC 08/23/2018 10:41 AM    [facility] [address], [place], [place] [address] Phone: [phone]) [phone]; Fax: [phone]

## 2018-08-24 ENCOUNTER — Ambulatory Visit: Payer: Medicare HMO | Admitting: Cardiology

## 2018-08-27 ENCOUNTER — Telehealth: Payer: Self-pay | Admitting: Cardiology

## 2018-08-27 NOTE — Telephone Encounter (Signed)
Wanted to know if we referred patient to rehab - but then changed her mind stating she didn't need information  close

## 2018-09-10 ENCOUNTER — Ambulatory Visit: Payer: Medicare HMO | Admitting: Cardiology

## 2018-09-19 ENCOUNTER — Telehealth: Payer: Self-pay | Admitting: Physician Assistant

## 2018-09-19 NOTE — Telephone Encounter (Signed)
Called patient about his upcoming appt in [place] on Monday.  He is currently in a rehab facility in [place] and has trouble with transportation.  He prefers a later appointment with Dr. Diona Browner in our [place] office only.  He is learning to walk after bilateral amputations.  He is not having any current cardiac symptoms.  Please cancel his appointment for this Monday with me and reschedule him for Dr. Diona Browner in [place] in May.  Please contact his wife with this appointment her number is [phone] thank you

## 2018-09-24 ENCOUNTER — Ambulatory Visit: Payer: Medicare HMO | Admitting: Physician Assistant

## 2018-10-18 ENCOUNTER — Telehealth: Payer: Self-pay | Admitting: Cardiology

## 2018-10-18 NOTE — Telephone Encounter (Signed)
Virtual Visit Pre-Appointment Phone Call  Steps For Call:  1. Confirm consent - "In the setting of the current Covid19 crisis, you are scheduled for a (phone or video) visit with your provider on (date) at (time).  Just as we do with many in-office visits, in order for you to participate in this visit, we must obtain consent.  If you'd like, I can send this to your mychart (if signed up) or email for you to review.  Otherwise, I can obtain your verbal consent now.  All virtual visits are billed to your insurance company just like a normal visit would be.  By agreeing to a virtual visit, we'd like you to understand that the technology does not allow for your provider to perform an examination, and thus may limit your provider's ability to fully assess your condition.  Finally, though the technology is pretty good, we cannot assure that it will always work on either your or our end, and in the setting of a video visit, we may have to convert it to a phone-only visit.  In either situation, we cannot ensure that we have a secure connection.  Are you willing to proceed?" STAFF: Did the patient verbally acknowledge consent to telehealth visit? Document YES/NO here:YES  2. Confirm the BEST phone number to call the day of the visit by including in appointment notes [phone]  3. Give patient instructions for WebEx/MyChart download to smartphone as below or Doximity/Doxy.me if video visit (depending on what platform provider is using)  4. Advise patient to be prepared with their blood pressure, heart rate, weight, any heart rhythm information, their current medicines, and a piece of paper and pen handy for any instructions they may receive the day of their visit  5. Inform patient they will receive a phone call 15 minutes prior to their appointment time (may be from unknown caller ID) so they should be prepared to answer  6. Confirm that appointment type is correct in Epic appointment notes (VIDEO vs  PHONE)     TELEPHONE CALL NOTE  [name] has been deemed a candidate for a follow-up tele-health visit to limit community exposure during the Covid-19 pandemic. I spoke with the patient via phone to ensure availability of phone/video source, confirm preferred email & phone number, and discuss instructions and expectations.  I reminded Alanda Amass to be prepared with any vital sign and/or heart rhythm information that could potentially be obtained via home monitoring, at the time of his visit. I reminded Dru Primeau to expect a phone call at the time of his visit if his visit.  Donata Duff Slaughter 10/18/2018 11:45 AM   INSTRUCTIONS FOR DOWNLOADING THE WEBEX APP TO SMARTPHONE  - If Apple, ask patient to go to [employer] and type in WebEx in the search bar. Download [employer], the blue/green circle. If Android, go to [employer] and type in [employer] in the search bar. The app is free but as with any other app downloads, their phone may require them to verify saved payment information or Apple/Android password.  - The patient does NOT have to create an account. - On the day of the visit, the assist will walk the patient through joining the meeting with the meeting number/password.  INSTRUCTIONS FOR DOWNLOADING THE MYCHART APP TO SMARTPHONE  - The patient must first make sure to have activated MyChart and know their login information - If Apple, go to [employer] and type in  MyChart in the search bar and download the app. If Android, ask patient to go to [employer] and type in [place] in the search bar and download the app. The app is free but as with any other app downloads, their phone may require them to verify saved payment information or Apple/Android password.  - The patient will need to then log into the app with their MyChart username and password, and select [place] as their healthcare provider to link the account. When it is time for  your visit, go to the MyChart app, find appointments, and click Begin Video Visit. Be sure to Select Allow for your device to access the Microphone and Camera for your visit. You will then be connected, and your provider will be with you shortly.  **If they have any issues connecting, or need assistance please contact MyChart service desk [phone])**  **If using a computer, in order to ensure the best quality for their visit they will need to use either of the following Internet Browsers: [employer], or Google Chrome**  IF USING DOXIMITY or DOXY.ME - The patient will receive a link just prior to their visit, either by text or email (to be determined day of appointment depending on if it's doxy.me or Doximity).     FULL LENGTH CONSENT FOR TELE-HEALTH VISIT   I hereby voluntarily request, consent and authorize [facility] and its employed or contracted physicians, physician assistants, nurse practitioners or other licensed health care professionals (the Practitioner), to provide me with telemedicine health care services (the Services") as deemed necessary by the treating Practitioner. I acknowledge and consent to receive the Services by the Practitioner via telemedicine. I understand that the telemedicine visit will involve communicating with the Practitioner through live audiovisual communication technology and the disclosure of certain medical information by electronic transmission. I acknowledge that I have been given the opportunity to request an in-person assessment or other available alternative prior to the telemedicine visit and am voluntarily participating in the telemedicine visit.  I understand that I have the right to withhold or withdraw my consent to the use of telemedicine in the course of my care at any time, without affecting my right to future care or treatment, and that the Practitioner or I may terminate the telemedicine visit at any time. I understand that I  have the right to inspect all information obtained and/or recorded in the course of the telemedicine visit and may receive copies of available information for a reasonable fee.  I understand that some of the potential risks of receiving the Services via telemedicine include:   Delay or interruption in medical evaluation due to technological equipment failure or disruption;  Information transmitted may not be sufficient (e.g. poor resolution of images) to allow for appropriate medical decision making by the Practitioner; and/or   In rare instances, security protocols could fail, causing a breach of personal health information.  Furthermore, I acknowledge that it is my responsibility to provide information about my medical history, conditions and care that is complete and accurate to the best of my ability. I acknowledge that Practitioner's advice, recommendations, and/or decision may be based on factors not within their control, such as incomplete or inaccurate data provided by me or distortions of diagnostic images or specimens that may result from electronic transmissions. I understand that the practice of medicine is not an exact science and that Practitioner makes no warranties or guarantees regarding treatment outcomes. I acknowledge that I will receive a copy  of this consent concurrently upon execution via email to the email address I last provided but may also request a printed copy by calling the office of [facility].    I understand that my insurance will be billed for this visit.   I have read or had this consent read to me.  I understand the contents of this consent, which adequately explains the benefits and risks of the Services being provided via telemedicine.   I have been provided ample opportunity to ask questions regarding this consent and the Services and have had my questions answered to my satisfaction.  I give my informed consent for the services to be provided through the  use of telemedicine in my medical care  By participating in this telemedicine visit I agree to the above.

## 2018-10-24 ENCOUNTER — Telehealth: Payer: Self-pay | Admitting: *Deleted

## 2018-10-24 ENCOUNTER — Encounter: Payer: Self-pay | Admitting: Cardiology

## 2018-10-24 ENCOUNTER — Encounter: Payer: Self-pay | Admitting: *Deleted

## 2018-10-24 NOTE — Telephone Encounter (Signed)
Medications and allergies reviewed with patient  Aware to check BP, HR and weight prior to visit  No lab work with PCP since last admission. Recent hospitalizations since last visit at [facility] and [facility]-records requested for review.  Patient verbally consented for telehealth visits with [facility] and understands that his insurance company will be billed for the encounter.

## 2018-10-24 NOTE — Progress Notes (Signed)
Virtual Visit via Video Note   This visit type was conducted due to national recommendations for restrictions regarding the COVID-19 Pandemic (e.g. social distancing) in an effort to limit this patient's exposure and mitigate transmission in our community.  Due to his co-morbid illnesses, this patient is at least at moderate risk for complications without adequate follow up.  This format is felt to be most appropriate for this patient at this time.  All issues noted in this document were discussed and addressed.  A limited physical exam was performed with this format.  Please refer to the patient's chart for his consent to telehealth for [facility].   Evaluation Performed:  Follow-up visit  Date:  10/25/2018   ID:  [name], [name] March 03, 1956, MRN [id number]  Patient Location: Home Provider Location: Office  PCP:  Ranae Palms, MD  Cardiologist:  Rozann Lesches, MD  Chief Complaint:  Follow-up atrial fibrillation  History of Present Illness:    [name] is a 63 y.o. male not seen in the office since May 2018.  We communicated via video conferencing today.  He is medically complex as detailed below.  I reviewed available records.  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  More recently he was hospitalized in [place] with severe anemia felt to be most likely related to GI blood loss although EGD did not reveal obvious source of bleeding.  He was to undergo further outpatient work-up by GI.  He ultimately received 8 units of packed red cells and was taken off Xarelto.  He did undergo rehabilitation following hospital stay but has been back home.  We went over his medications which are listed below.  He has been taking aspirin 81 mg daily and tolerating it.  He had been on Zestril as high as 10 mg daily but ran out  and reports fluctuating blood pressures.  Systolics ranging from 678-938 intermittently.  He states that he had lost some weight that he is now gaining back as diet has liberalized.  He does not report any chest pain or palpitations.  The patient does not have symptoms concerning for COVID-19 infection (fever, chills, cough, or new shortness of breath).    Past Medical History:  Diagnosis Date  . Alcohol abuse   . Atrial fibrillation ([place])   . Chronic diastolic heart failure ([place])   . COPD (chronic obstructive pulmonary disease) ([place])   . CVA (cerebral infarction)    Left MCA distribution  . Essential hypertension   . Hemothorax on right   . Hyperlipidemia   . PAD (peripheral artery disease) ([facility])    Status post bilateral BKA  . Type 2 diabetes mellitus ([place])    Past Surgical History:  Procedure Laterality Date  . Hemothorax drainage  08/18/2008  . THORACOTOMY     Right     Current Meds  Medication Sig  . ALPRAZolam (XANAX) 0.5 MG tablet Take 0.5 mg by mouth 2 (two) times daily.  Marland Kitchen aspirin EC 81 MG tablet Take 81 mg by mouth daily.  . furosemide (LASIX) 40 MG tablet Take 40 mg by mouth daily as needed.  . insulin lispro (HUMALOG) 100 UNIT/ML injection Inject into the skin 3 (three) times daily before meals.  . Lactobacillus-Inulin ([place] PO) Take 1 capsule by mouth 2 (two) times a day.  Marland Kitchen  liraglutide (VICTOZA) 18 MG/3ML SOPN Inject 1.2 mg into the skin daily.  . Multiple Vitamin (MULTIVITAMIN) tablet Take 1 tablet by mouth daily.    Marland Kitchen omeprazole (PRILOSEC OTC) 20 MG tablet Take 20 mg by mouth daily.    Marland Kitchen PROAIR HFA 108 (90 BASE) MCG/ACT inhaler Inhale 1 puff into the lungs every 6 (six) hours as needed.   . sertraline (ZOLOFT) 50 MG tablet Take 100 mg by mouth daily.      Allergies:   Hydrocodone-acetaminophen; Lorazepam; and Oxycodone-acetaminophen   Social History   Tobacco Use  . Smoking status: Former Smoker    Packs/day: 3.50    Years:  35.00    Pack years: 122.50    Types: Cigarettes    Start date: 06/06/1962    Last attempt to quit: 07/04/2008    Years since quitting: 10.3  . Smokeless tobacco: Never Used  . Tobacco comment: Started at age 50  Substance Use Topics  . Alcohol use: Yes    Alcohol/week: 0.0 standard drinks    Comment: 12 pack or more a day for years. Has now cut back a little to 9-10 daily. Never blacks out.  . Drug use: No     Family Hx: The patient's family history includes Aneurysm in his father; Diabetes in his mother and another family member; Hypertension in an other family member; Stroke in his sister.  ROS:   Please see the history of present illness.    NYHA class II dyspnea.  Using bilateral prostheses. All other systems reviewed and are negative.   Prior CV studies:   The following studies were reviewed today:  Echocardiogram 07/23/2015: Study Conclusions  - Left ventricle: The cavity size was normal. Wall thickness was increased in a pattern of moderate LVH. Systolic function was vigorous. The estimated ejection fraction was in the range of 65% to 70%. Wall motion was normal; there were no regional wall motion abnormalities. The study is not technically sufficient to allow evaluation of LV diastolic function. - Aortic valve: Mildly to moderately calcified annulus. Trileaflet; mildly calcified leaflets. - Mitral valve: Calcified annulus. Mildly calcified leaflets . There was trivial regurgitation. - Left atrium: The atrium was moderately to severely dilated. - Right atrium: The atrium was severely dilated. - Atrial septum: A patent foramen ovale cannot be excluded. - Tricuspid valve: There was trivial regurgitation. Peak RV-RA gradient (S): 34 mm Hg. - Inferior vena cava: Unable to assess CVP. - Pericardium, extracardiac: A prominent pericardial fat pad was present.  Impressions:  - Moderate LVH with LVEF 65-70%. Indeterminate diastolic function in the  setting of atrial fibrillation. Moderate to severe left atrial enlargement and severe right atrial enlargement. Moderate MAC with mildly calcified mitral leaflets and trivial mitral regurgitation. Sclerotic aortic valve without stenosis. Trivial tricuspid regurgitation. RV-RA gradient 34 mmHg.  Carotid Dopplers 07/23/2015: Stable, chronic bilateral ICA occlusions with greater than 50% bilateral ECA stenoses, normal subclavian this, and patent vertebral arteries with antegrade flow.  Echocardiogram 08/08/2018 [facility]): Mild LVH with mild chamber dilatation and LVEF 60 to 65%, mildly dilated right ventricle with normal contraction, moderate tricuspid regurgitation with PASP 58 mmHg, mild left atrial enlargement, mild mitral regurgitation, mildly thickened aortic valve without stenosis, normal aortic root size, no pericardial effusion.  CT head and neck 08/08/2018 [facility]): Occlusion bilateral ICA with reconstitution at the level of the supraclinoid ICAs via patent posterior communicating arteries.  Also prominent ophthalmic arteries which may be providing collateral supply.  Minimal narrowing in one segment left MCA.  Probable MCA branch occlusion which looks to be chronic with previous stroke.  Hypoplastic A1 segment right anterior cerebral artery.  Short segment severe stenosis intracranial portion of left vertebral artery.  Moderate stenosis intracranial portion of right vertebral artery.  Labs/Other Tests and Data Reviewed:    EKG:  An ECG dated 12/01/2016 was personally reviewed today and demonstrated:  Rate controlled atrial fibrillation with low voltage in the limb leads.  Recent Labs:  October 2019: Hemoglobin 7.4, potassium 4.2, BUN 18, creatinine 0.93 February 2020: Potassium 4.0, BUN 27, creatinine 0.98, AST 65, ALT 62, BNP 254, hemoglobin as low as 3.4 up to 8.9  Wt Readings from Last 3 Encounters:  10/25/18 240 lb (108.9 kg)  12/01/16 282 lb 6.4 oz (128.1 kg)   07/15/15 290 lb (131.5 kg)     Objective:    Vital Signs:  BP (!) 146/62   Pulse 80   Resp 20   Ht 6' (1.829 m)   Wt 240 lb (108.9 kg)   SpO2 96%   BMI 32.55 kg/m    General: Patient in no distress, seated in chair at his home. HEENT: Conjunctiva and lids normal.  Wearing glasses. Skin: Pale with normal appearing turgor. Lungs: Soft upper airway sounds while speaking, no obvious wheezing. Neuropsychiatric: Affect grossly appropriate.  Gaze is conjugate.  Moves extremities.  ASSESSMENT & PLAN:    1.  Permanent atrial fibrillation.  Heart rate is adequately controlled based on recent vital signs and he reports no palpitations.  As noted above he was taken off Xarelto with life-threatening GI bleed but is tolerating aspirin 81 mg daily so far.  He isnot on any AV nodal blockers at this time.  2.  Essential hypertension.  Blood pressure fluctuating.  I asked him to record blood pressure once a day for the next few weeks and we will follow-up to see if we might consider resuming lisinopril even at lower dose.  Otherwise follow-up with PCP as scheduled.  3.  PAD now status post bilateral BKA's.  4.  Severe carotid artery disease with bilateral occlusion and history of stroke.  He is on aspirin previously on Zocor.  Medications have changed a lot with his hospitalizations over the last year.  We will follow-up for further modification going forward.  COVID-19 Education: The signs and symptoms of COVID-19 were discussed with the patient and how to seek care for testing (follow up with PCP or arrange E-visit).  The importance of social distancing was discussed today.  Time:   Today, I have spent 10 minutes with the patient with telehealth technology discussing the above problems.     Medication Adjustments/Labs and Tests Ordered: Current medicines are reviewed at length with the patient today.  Concerns regarding medicines are outlined above.   Tests Ordered: No orders of the defined  types were placed in this encounter.   Medication Changes: No orders of the defined types were placed in this encounter.   Disposition:  Follow up 6 weeks with telehealth visit.  Signed, Rozann Lesches, MD  10/25/2018 4:12 PM    [facility]

## 2018-10-25 ENCOUNTER — Telehealth (INDEPENDENT_AMBULATORY_CARE_PROVIDER_SITE_OTHER): Payer: Medicare HMO | Admitting: Cardiology

## 2018-10-25 ENCOUNTER — Encounter: Payer: Self-pay | Admitting: Cardiology

## 2018-10-25 VITALS — BP 146/62 | HR 80 | Resp 20 | Ht 72.0 in | Wt 240.0 lb

## 2018-10-25 DIAGNOSIS — I6523 Occlusion and stenosis of bilateral carotid arteries: Secondary | ICD-10-CM

## 2018-10-25 DIAGNOSIS — I739 Peripheral vascular disease, unspecified: Secondary | ICD-10-CM

## 2018-10-25 DIAGNOSIS — Z7189 Other specified counseling: Secondary | ICD-10-CM | POA: Diagnosis not present

## 2018-10-25 DIAGNOSIS — I10 Essential (primary) hypertension: Secondary | ICD-10-CM | POA: Diagnosis not present

## 2018-10-25 DIAGNOSIS — I4821 Permanent atrial fibrillation: Secondary | ICD-10-CM | POA: Diagnosis not present

## 2018-10-25 NOTE — Patient Instructions (Addendum)
Medication Instructions:   Your physician recommends that you continue on your current medications as directed. Please refer to the Current Medication list given to you today.  Remain off lisinopril for now  Labwork:  NONE  Testing/Procedures:  NONE  Follow-Up:  Your physician recommends that you schedule a follow-up appointment in: 6 weeks for a tele-health visit.  Any Other Special Instructions Will Be Listed Below (If Applicable). Your physician has requested that you regularly monitor and record your blood pressure readings at home daily for 2 weeks. Please use the same machine at the same time of day to check your readings and record them. Please contact our office with your readings.  If you need a refill on your cardiac medications before your next appointment, please call your pharmacy.

## 2018-11-13 ENCOUNTER — Telehealth: Payer: Self-pay | Admitting: *Deleted

## 2018-11-13 NOTE — Telephone Encounter (Signed)
Contacted to get BP readings for the past 2 weeks since last tele-health visit. Patient said he would have hid aide call office tomorrow with these readings.

## 2018-11-14 NOTE — Telephone Encounter (Signed)
Aide was out today and he will contact office tomorrow with BP readings.

## 2018-11-15 ENCOUNTER — Telehealth: Payer: Self-pay | Admitting: Cardiology

## 2018-11-15 NOTE — Telephone Encounter (Signed)
Please call concerning BP readings  °

## 2018-11-15 NOTE — Telephone Encounter (Signed)
Returned pt call. His wife wanted to inform Dr. Diona Browner of blood pressures over past week . 5/7: 104/51 5/8: 118/62 5/9: 110/70 5/10: - did not do  5/11: 142/80 5/12: 117/71 5/13: 150/80   I will forward as an FYI:

## 2018-11-16 NOTE — Telephone Encounter (Signed)
Reviewed.  Based on these numbers, I would not resume lisinopril as yet.  Continue with current follow-up plan.

## 2018-11-16 NOTE — Telephone Encounter (Signed)
Called pt. No answer, left detailed message on pt's private voicemail.  

## 2018-11-20 NOTE — Telephone Encounter (Signed)
See most recent phone encounter for details.

## 2018-11-20 NOTE — Telephone Encounter (Signed)
Patient informed and verbalized understanding of plan. 

## 2018-11-21 ENCOUNTER — Telehealth: Payer: Self-pay | Admitting: Cardiology

## 2018-11-21 NOTE — Telephone Encounter (Signed)
Noted.  Will fax this note to [facility].

## 2018-11-21 NOTE — Telephone Encounter (Signed)
Actually, her PCP could take care of this just as easily.  I am not opposed to sending in a limited order to check blood pressure daily for 1 to 2 weeks however.

## 2018-11-21 NOTE — Telephone Encounter (Signed)
[facility]  Fax:  [phone]  Attn:  Raynelle Fanning   Needs order faxed to be able to go out daily to check BP (vitals).  Per request on 10/25/2018 - telehealth visit.  2.  Essential hypertension.  Blood pressure fluctuating.  I asked him to record blood pressure once a day for the next few weeks and we will follow-up to see if we might consider resuming lisinopril even at lower dose.  Otherwise follow-up with PCP as scheduled.

## 2018-11-21 NOTE — Telephone Encounter (Signed)
Please give pt's aid Lucita Lora [phone] a call concerning pt's in home checks.

## 2018-12-05 ENCOUNTER — Ambulatory Visit: Payer: Medicare HMO | Admitting: Cardiology

## 2018-12-17 ENCOUNTER — Telehealth (INDEPENDENT_AMBULATORY_CARE_PROVIDER_SITE_OTHER): Payer: Medicare HMO | Admitting: Cardiology

## 2018-12-17 ENCOUNTER — Encounter: Payer: Self-pay | Admitting: Cardiology

## 2018-12-17 VITALS — BP 120/65 | HR 80 | Temp 97.3°F | Ht 75.0 in | Wt 234.0 lb

## 2018-12-17 DIAGNOSIS — I4821 Permanent atrial fibrillation: Secondary | ICD-10-CM | POA: Diagnosis not present

## 2018-12-17 DIAGNOSIS — E782 Mixed hyperlipidemia: Secondary | ICD-10-CM

## 2018-12-17 DIAGNOSIS — I10 Essential (primary) hypertension: Secondary | ICD-10-CM | POA: Diagnosis not present

## 2018-12-17 DIAGNOSIS — E785 Hyperlipidemia, unspecified: Secondary | ICD-10-CM | POA: Insufficient documentation

## 2018-12-17 DIAGNOSIS — I6523 Occlusion and stenosis of bilateral carotid arteries: Secondary | ICD-10-CM

## 2018-12-17 DIAGNOSIS — I739 Peripheral vascular disease, unspecified: Secondary | ICD-10-CM | POA: Diagnosis not present

## 2018-12-17 DIAGNOSIS — Z79899 Other long term (current) drug therapy: Secondary | ICD-10-CM

## 2018-12-17 NOTE — Patient Instructions (Addendum)
Medication Instructions:   Your physician recommends that you continue on your current medications as directed. Please refer to the Current Medication list given to you today.  Labwork:  Your physician recommends that you return for a FASTING lipid profile and CMET at [facility] or [facility]. Please do not eat after midnight before you go. Your lab orders have been faxed to the lab.  Testing/Procedures:  NONE  Follow-Up:  Your physician recommends that you schedule a follow-up appointment in: 3 months.  Any Other Special Instructions Will Be Listed Below (If Applicable).  If you need a refill on your cardiac medications before your next appointment, please call your pharmacy.

## 2018-12-17 NOTE — Progress Notes (Signed)
Virtual Visit via Telephone Note   This visit type was conducted due to national recommendations for restrictions regarding the COVID-19 Pandemic (e.g. social distancing) in an effort to limit this patient's exposure and mitigate transmission in our community.  Due to his co-morbid illnesses, this patient is at least at moderate risk for complications without adequate follow up.  This format is felt to be most appropriate for this patient at this time.  The patient did not have access to video technology/had technical difficulties with video requiring transitioning to audio format only (telephone).  All issues noted in this document were discussed and addressed.  No physical exam could be performed with this format.  Please refer to the patient's chart for his  consent to telehealth for [facility].  Date:  12/17/2018   ID:  [name], DOB 01-May-1956, MRN [id number]  Patient Location: Home Provider Location: Office  PCP:  Ardyth ManPark, Chan M, MD  Cardiologist:  Nona DellSamuel Meranda Dechaine, MD Electrophysiologist:  None   Evaluation Performed:  Follow-Up Visit  Chief Complaint:   Cardiac follow-up  History of Present Illness:    [name] is a 63 y.o. male last assessed in April.  We had difficulty establishing video access today and spoke by phone.  He does not report any angina symptoms at this time, no palpitations.  He is currently working with physical therapy twice a week at home, has bilateral leg prostheses and is able to walk to some degree with a walker, but uses a wheelchair otherwise.  I reviewed his medications which are listed below.  He uses Lasix only occasionally.  I note that his weight is down overall.  He does not report any orthopnea or PND.  I also discussed his home blood pressures, systolics are generally ranging 120-160 range on the high end most of the time.  Blood pressure today was the best recently.  He is currently not on any antihypertensive  medications, previously had been on lisinopril, Coreg, and Procardia back in 2018.  The patient does not have symptoms concerning for COVID-19 infection (fever, chills, cough, or new shortness of breath).    Past Medical History:  Diagnosis Date   Alcohol abuse    Atrial fibrillation ([facility])    Chronic diastolic heart failure ([facility])    COPD (chronic obstructive pulmonary disease) ([facility])    CVA (cerebral infarction)    Left MCA distribution   Essential hypertension    Hemothorax on right    Hyperlipidemia    PAD (peripheral artery disease) ([facility])    Status post bilateral BKA   Type 2 diabetes mellitus ([facility])    Past Surgical History:  Procedure Laterality Date   Hemothorax drainage  08/18/2008   THORACOTOMY     Right     Current Meds  Medication Sig   ALPRAZolam (XANAX) 0.5 MG tablet Take 0.5 mg by mouth 2 (two) times daily.   aspirin EC 81 MG tablet Take 81 mg by mouth daily.   furosemide (LASIX) 40 MG tablet Take 40 mg by mouth daily as needed.   insulin lispro (HUMALOG) 100 UNIT/ML injection Inject into the skin 3 (three) times daily before meals.   Lactobacillus-Inulin (CULTURELLE DIGESTIVE HEALTH PO) Take 1 capsule by mouth 2 (two) times a day.   liraglutide (VICTOZA) 18 MG/3ML SOPN Inject 1.2 mg into the skin daily.   Multiple Vitamin (MULTIVITAMIN) tablet Take 1 tablet by mouth daily.     omeprazole (PRILOSEC OTC) 20 MG tablet Take 20  mg by mouth daily.     PROAIR HFA 108 (90 BASE) MCG/ACT inhaler Inhale 1 puff into the lungs every 6 (six) hours as needed.    sertraline (ZOLOFT) 50 MG tablet Take 100 mg by mouth daily.      Allergies:   Hydrocodone-acetaminophen, Lorazepam, and Oxycodone-acetaminophen   Social History   Tobacco Use   Smoking status: Former Smoker    Packs/day: 3.50    Years: 35.00    Pack years: 122.50    Types: Cigarettes    Start date: 06/06/1962    Quit date: 07/04/2008    Years since quitting: 10.4   Smokeless tobacco:  Never Used   Tobacco comment: Started at age 44  Substance Use Topics   Alcohol use: Yes    Alcohol/week: 0.0 standard drinks    Comment: 12 pack or more a day for years. Has now cut back a little to 9-10 daily. Never blacks out.   Drug use: No     Family Hx: The patient's family history includes Aneurysm in his father; Diabetes in his mother and another family member; Hypertension in an other family member; Stroke in his sister.  ROS:   Please see the history of present illness. All other systems reviewed and are negative.   Prior CV studies:   The following studies were reviewed today:  Echocardiogram 08/08/2018 [facility]): Mild LVH with mild chamber dilatation and LVEF 60 to 65%, mildly dilated right ventricle with normal contraction, moderate tricuspid regurgitation with PASP 58 mmHg, mild left atrial enlargement, mild mitral regurgitation, mildly thickened aortic valve without stenosis, normal aortic root size, no pericardial effusion.  CT head and neck 08/08/2018 [facility]): Occlusion bilateral ICA with reconstitution at the level of the supraclinoid ICAs via patent posterior communicating arteries.  Also prominent ophthalmic arteries which may be providing collateral supply.  Minimal narrowing in one segment left MCA.  Probable MCA branch occlusion which looks to be chronic with previous stroke.  Hypoplastic A1 segment right anterior cerebral artery.  Short segment severe stenosis intracranial portion of left vertebral artery.  Moderate stenosis intracranial portion of right vertebral artery.  Labs/Other Tests and Data Reviewed:    EKG:  An ECG dated 12/01/2016 was personally reviewed today and demonstrated:  Rate controlled atrial fibrillation with low voltage in limb leads.  Recent Labs:   February 2020: Potassium 4.0, BUN 27, creatinine 0.98, AST 65, ALT 62, BNP 254, hemoglobin as low as 3.4 up to 8.9  Wt Readings from Last 3 Encounters:  12/17/18 234 lb (106.1 kg)    10/25/18 240 lb (108.9 kg)  12/01/16 282 lb 6.4 oz (128.1 kg)     Objective:    Vital Signs:  BP 120/65    Pulse 80    Temp (!) 97.3 F (36.3 C)    Ht 6\' 3"  (1.905 m)    Wt 234 lb (106.1 kg)    BMI 29.25 kg/m    Chronic hoarseness of voice.  No obvious breathlessness or coughing while talking on the phone.  ASSESSMENT & PLAN:    1.  Permanent atrial fibrillation.  Heart rate is in the 80s today, currently not on AV nodal blocker.  He is no longer on Xarelto status post life-threatening GI bleed.  States that he is tolerating low-dose aspirin with some skin bruising, but no obvious major bleeding episodes.  2.  Essential hypertension.  Plan to check CMET and most likely initiate low-dose ARB.  Follow-up lab work first.  3.  Severe  carotid artery disease with bilateral occlusion and history of stroke.  He is currently on aspirin.  We will likely resume Zocor after following up on lab work first.  4.  PAD status post bilateral BKA's.  He is working with PT at home and has prostheses.  COVID-19 Education: The signs and symptoms of COVID-19 were discussed with the patient and how to seek care for testing (follow up with PCP or arrange E-visit).  The importance of social distancing was discussed today.  Time:   Today, I have spent 13 minutes with the patient with telehealth technology discussing the above problems.     Medication Adjustments/Labs and Tests Ordered: Current medicines are reviewed at length with the patient today.  Concerns regarding medicines are outlined above.   Tests Ordered: Orders Placed This Encounter  Procedures   Comprehensive metabolic panel   Lipid panel    Medication Changes: No orders of the defined types were placed in this encounter.   Follow Up:  In Person 3 months in the [place] office.  Signed, Nona DellSamuel Felicidad Sugarman, MD  12/17/2018 10:47 AM    [facility]

## 2018-12-26 ENCOUNTER — Telehealth: Payer: Self-pay | Admitting: Cardiology

## 2018-12-26 NOTE — Telephone Encounter (Signed)
Pt aware that we would fax orders to [facility]

## 2018-12-26 NOTE — Telephone Encounter (Signed)
Patient is in homehealth with [facility]. They would like to draw labs and need orders faxed to them  Fax # [phone] office number [phone]   Please call patient and let them know, whether or not they need to go to [facility] to draw labs or if homehealth can draw

## 2019-01-02 ENCOUNTER — Encounter: Payer: Self-pay | Admitting: *Deleted

## 2019-01-31 ENCOUNTER — Telehealth: Payer: Self-pay | Admitting: Cardiology

## 2019-01-31 NOTE — Telephone Encounter (Signed)
Patient called in regards to recent labs. Was calling for test results.

## 2019-01-31 NOTE — Telephone Encounter (Signed)
Results not available. Requested from [facility].

## 2019-02-01 NOTE — Telephone Encounter (Signed)
Results received no lipid results sent - requested again

## 2019-02-04 ENCOUNTER — Telehealth: Payer: Self-pay | Admitting: *Deleted

## 2019-02-04 DIAGNOSIS — I10 Essential (primary) hypertension: Secondary | ICD-10-CM

## 2019-02-04 MED ORDER — LOSARTAN POTASSIUM 25 MG PO TABS: 25.0000 mg | ORAL_TABLET | Freq: Every day | ORAL | 1 refills | Status: DC

## 2019-02-04 NOTE — Telephone Encounter (Signed)
Pt voiced understanding - Cozaar sent to pharmacy - mailed pt lab results - routed to pcp

## 2019-02-04 NOTE — Telephone Encounter (Signed)
-----   Message from Satira Sark, MD sent at 02/01/2019  1:08 PM EDT ----- Results reviewed.  Lab work from June 30 reviewed, creatinine normal at 0.89 and potassium normal at 4.7.  LDL cholesterol 99.  LFTs normal.  We discussed potential addition of antihypertensive medicine due to his reported home blood pressures at most recent telehealth encounter.  Let's consider starting Cozaar 25 mg once daily.  Would need recheck BMET in 7 to 10 days.

## 2019-02-18 ENCOUNTER — Telehealth: Payer: Self-pay | Admitting: Cardiology

## 2019-02-18 NOTE — Telephone Encounter (Signed)
Please call Marcell Barlow RN w/ [facility] in [place] @ [phone] concerning lab orders she received for 02/04/2019

## 2019-02-18 NOTE — Telephone Encounter (Signed)
Confirmed bmet order and will draw tomorrow.

## 2019-02-21 ENCOUNTER — Encounter: Payer: Self-pay | Admitting: *Deleted

## 2019-02-25 ENCOUNTER — Encounter: Payer: Self-pay | Admitting: Cardiology

## 2019-02-26 ENCOUNTER — Telehealth: Payer: Self-pay | Admitting: *Deleted

## 2019-02-26 NOTE — Telephone Encounter (Signed)
-----   Message from Satira Sark, MD sent at 02/25/2019 11:42 AM EDT ----- Results reviewed.  Renal function and potassium remain normal after recent addition of Cozaar.  Continue same for now.

## 2019-02-26 NOTE — Telephone Encounter (Signed)
Patient informed. 

## 2019-03-22 ENCOUNTER — Ambulatory Visit: Payer: Medicare HMO | Admitting: Cardiology

## 2019-04-01 ENCOUNTER — Other Ambulatory Visit: Payer: Self-pay

## 2019-04-01 ENCOUNTER — Encounter: Payer: Medicare (Managed Care) | Admitting: Cardiology

## 2019-04-01 NOTE — Progress Notes (Signed)
This encounter was created in error - please disregard.

## 2019-04-22 ENCOUNTER — Encounter: Payer: Self-pay | Admitting: Cardiology

## 2019-04-22 ENCOUNTER — Telehealth (INDEPENDENT_AMBULATORY_CARE_PROVIDER_SITE_OTHER): Payer: Medicare (Managed Care) | Admitting: Cardiology

## 2019-04-22 VITALS — BP 148/76 | HR 84

## 2019-04-22 DIAGNOSIS — I10 Essential (primary) hypertension: Secondary | ICD-10-CM | POA: Diagnosis not present

## 2019-04-22 DIAGNOSIS — I4821 Permanent atrial fibrillation: Secondary | ICD-10-CM

## 2019-04-22 DIAGNOSIS — I6523 Occlusion and stenosis of bilateral carotid arteries: Secondary | ICD-10-CM

## 2019-04-22 MED ORDER — SIMVASTATIN 20 MG PO TABS: 20.0000 mg | ORAL_TABLET | Freq: Every day | ORAL | 3 refills | Status: AC

## 2019-04-22 NOTE — Patient Instructions (Signed)
Medication Instructions:  START Zocor 20 mg at bedtime  *If you need a refill on your cardiac medications before your next appointment, please call your pharmacy*  Lab Work: Fasting lipids in 6 months, JUST BEFORE NEXT VISIT If you have labs (blood work) drawn today and your tests are completely normal, you will receive your results only by: Marland Kitchen MyChart Message (if you have MyChart) OR . A paper copy in the mail If you have any lab test that is abnormal or we need to change your treatment, we will call you to review the results.  Testing/Procedures: None today  Follow-Up: At [facility], you and your health needs are our priority.  As part of our continuing mission to provide you with exceptional heart care, we have created designated Provider Care Teams.  These Care Teams include your primary Cardiologist (physician) and Advanced Practice Providers (APPs -  Physician Assistants and Nurse Practitioners) who all work together to provide you with the care you need, when you need it.  Your next appointment:   6 months  The format for your next appointment:   Either In Person or Virtual  Provider:   Rozann Lesches, MD  Other Instructions None

## 2019-04-22 NOTE — Progress Notes (Signed)
Virtual Visit via Telephone Note   This visit type was conducted due to national recommendations for restrictions regarding the COVID-19 Pandemic (e.g. social distancing) in an effort to limit this patient's exposure and mitigate transmission in our community.  Due to his co-morbid illnesses, this patient is at least at moderate risk for complications without adequate follow up.  This format is felt to be most appropriate for this patient at this time.  The patient did not have access to video technology/had technical difficulties with video requiring transitioning to audio format only (telephone).  All issues noted in this document were discussed and addressed.  No physical exam could be performed with this format.  Please refer to the patient's chart for his  consent to telehealth for [facility].   Date:  04/22/2019   ID:  [place], DOB March 10, 1956, MRN [id number]  Patient Location: Home Provider Location: Office  PCP:  Ranae Palms, MD  Cardiologist:  Rozann Lesches, MD Electrophysiologist:  None   Evaluation Performed:  Follow-Up Visit  Chief Complaint:   Cardiac follow-up  History of Present Illness:    Lynette Noah is a 63 y.o. male last assessed via telehealth encounter in June.  We spoke by phone today.  He continues to have physical therapy at home, twice a week.  He states that his arms have gotten stronger, he is still working on trying to walk with bilateral leg prostheses and has a wheelchair.  He does not report any chest pain or palpitations.  Denies any falls. He is no longer anticoagulated with Xarelto secondary to life-threatening GI bleed.  He was started on Cozaar 25 mg daily since the last visit.  States that he is tolerating this well, follow-up lab work noted below.  The patient does not have symptoms concerning for COVID-19 infection (fever, chills, cough, or new shortness of breath).    Past Medical History:  Diagnosis Date  .  Alcohol abuse   . Atrial fibrillation ([place])   . Chronic diastolic heart failure ([place])   . COPD (chronic obstructive pulmonary disease) ([place])   . CVA (cerebral infarction)    Left MCA distribution  . Essential hypertension   . Hemothorax on right   . Hyperlipidemia   . PAD (peripheral artery disease) ([facility])    Status post bilateral BKA  . Type 2 diabetes mellitus ([place])    Past Surgical History:  Procedure Laterality Date  . Hemothorax drainage  08/18/2008  . THORACOTOMY     Right     Current Meds  Medication Sig  . ALPRAZolam (XANAX) 0.5 MG tablet Take 0.5 mg by mouth 2 (two) times daily.  Marland Kitchen ALPRAZolam (XANAX) 1 MG tablet Take 1 mg by mouth daily as needed.  Marland Kitchen aspirin EC 81 MG tablet Take 81 mg by mouth daily.  . fluticasone (FLONASE) 50 MCG/ACT nasal spray Place 2 sprays into both nostrils daily.  . furosemide (LASIX) 40 MG tablet Take 40 mg by mouth daily as needed.  . insulin lispro (HUMALOG) 100 UNIT/ML injection Inject into the skin 3 (three) times daily before meals.  . Lactobacillus-Inulin (Delta PO) Take 1 capsule by mouth 2 (two) times a day.  . liraglutide (VICTOZA) 18 MG/3ML SOPN Inject 1.2 mg into the skin daily.  Marland Kitchen losartan (COZAAR) 25 MG tablet Take 1 tablet (25 mg total) by mouth daily.  . Multiple Vitamin (MULTIVITAMIN) tablet Take 1 tablet by mouth daily.    Marland Kitchen omeprazole (PRILOSEC OTC) 20  MG tablet Take 20 mg by mouth daily.    Marland Kitchen. PROAIR HFA 108 (90 BASE) MCG/ACT inhaler Inhale 1 puff into the lungs every 6 (six) hours as needed.   . sertraline (ZOLOFT) 50 MG tablet Take 100 mg by mouth daily.      Allergies:   Hydrocodone-acetaminophen, Lorazepam, and Oxycodone-acetaminophen   Social History   Tobacco Use  . Smoking status: Former Smoker    Packs/day: 3.50    Years: 35.00    Pack years: 122.50    Types: Cigarettes    Start date: 06/06/1962    Quit date: 07/04/2008    Years since quitting: 10.8  . Smokeless tobacco: Never Used  .  Tobacco comment: Started at age 89  Substance Use Topics  . Alcohol use: Yes    Alcohol/week: 0.0 standard drinks    Comment: 12 pack or more a day for years. Has now cut back a little to 9-10 daily. Never blacks out.  . Drug use: No     Family Hx: The patient's family history includes Aneurysm in his father; Diabetes in his mother and another family member; Hypertension in an other family member; Stroke in his sister.  ROS:   Please see the history of present illness.    Hearing loss. All other systems reviewed and are negative.   Prior CV studies:   The following studies were reviewed today:  Echocardiogram 08/08/2018 [facility]([place]): Mild LVH with mild chamber dilatation and LVEF 60 to 65%, mildly dilated right ventricle with normal contraction, moderate tricuspid regurgitation with PASP 58 mmHg, mild left atrial enlargement, mild mitral regurgitation, mildly thickened aortic valve without stenosis, normal aortic root size, no pericardial effusion.  CT head and neck 08/08/2018([place]): Occlusion bilateral ICA with reconstitution at the level of the supraclinoid ICAs via patent posterior communicating arteries. Also prominent ophthalmic arteries which may be providing collateral supply. Minimal narrowing in one segment left MCA. Probable MCA branch occlusion which looks to be chronic with previous stroke. Hypoplastic A1 segment right anterior cerebral artery. Short segment severe stenosis intracranial portion of left vertebral artery. Moderate stenosis intracranial portion of right vertebral artery.  Labs/Other Tests and Data Reviewed:    EKG:  An ECG dated 08/08/2018 was personally reviewed today and demonstrated:  Atrial fibrillation with nonspecific ST-T changes.  Recent Labs:  August 2020: BUN 28, creatinine 1.1, potassium 3.9  Recent Lipid Panel   Wt Readings from Last 3 Encounters:  12/17/18 234 lb (106.1 kg)  10/25/18 240 lb (108.9 kg)  12/01/16 282 lb 6.4 oz  (128.1 kg)     Objective:    Vital Signs:  BP (!) 148/76   Pulse 84    Patient spoke in full sentences, not short of breath. No audible wheezing or coughing.  ASSESSMENT & PLAN:    1.  Permanent atrial fibrillation.  He reports no palpitations and heart rate is in the 80s today without need for AV nodal blocker.  He is no longer anticoagulated with history of life-threatening GI bleed.  Continue aspirin for now.  2.  Essential hypertension, tolerating losartan.  3.  Severe carotid artery disease with bilateral occlusion and history of stroke.  Continue aspirin, starting Zocor 20 mg daily.  Follow-up FLP for next visit.  4.  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  He continues to work with home PT.  COVID-19 Education: The signs and symptoms of COVID-19 were discussed with the patient and how to seek care for testing (follow up with PCP  or arrange E-visit).  The importance of social distancing was discussed today.  Time:   Today, I have spent 5 minutes with the patient with telehealth technology discussing the above problems.     Medication Adjustments/Labs and Tests Ordered: Current medicines are reviewed at length with the patient today.  Concerns regarding medicines are outlined above.   Tests Ordered: Orders Placed This Encounter  Procedures  . Lipid Profile    Medication Changes: Meds ordered this encounter  Medications  . simvastatin (ZOCOR) 20 MG tablet    Sig: Take 1 tablet (20 mg total) by mouth at bedtime.    Dispense:  90 tablet    Refill:  3    Follow Up:  Either In Person or Virtual in 6 month(s)  Signed, Nona Dell, MD  04/22/2019 11:52 AM    [facility]

## 2019-07-28 ENCOUNTER — Other Ambulatory Visit: Payer: Self-pay | Admitting: Cardiology

## 2019-08-22 ENCOUNTER — Telehealth: Payer: Medicare (Managed Care) | Admitting: Cardiology

## 2019-09-02 DEATH — deceased

## 2019-09-04 ENCOUNTER — Encounter: Payer: Self-pay | Admitting: Cardiology

## 2019-09-04 NOTE — Progress Notes (Deleted)
{Choose 1 Note Type (Telehealth Visit or Telephone Visit):[phone]}   Date:  09/04/2019   ID:  [name], DOB 1955-09-15, MRN [id number]  {Patient [phone]::"Home"} {Provider Location:[phone]::"Home"}  PCP:  Ardyth Man, MD  Cardiologist:  Nona Dell, MD Electrophysiologist:  None   Evaluation Performed:  {Choose Visit [phone]::"Follow-Up Visit"}  Chief Complaint:  ***  History of Present Illness:    [name] is a 64 y.o. male last assessed via telehealth encounter in October 2020.   Records indicate ER visit in early February with hematuria.  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  He currently has home health nursing.  He was admitted to [facility] in January with acute on chronic hypoxic respiratory failure with what was felt to be COPD exacerbation and also acute on chronic diastolic heart failure.  I cannot tell whether he had an updated echocardiogram during that stay or not based on review of the available records.  As noted previously he has a history of life-threatening GI bleed and is no longer anticoagulated for his atrial fibrillation.  The patient {does/does not:200015} have symptoms concerning for COVID-19 infection (fever, chills, cough, or new shortness of breath).    Past Medical History:  Diagnosis Date  . Alcohol abuse   . Atrial fibrillation ([facility])   . Chronic diastolic heart failure ([facility])   . Chronic hypoxemic respiratory failure ([facility])   . COPD (chronic obstructive pulmonary disease) ([facility])   . CVA (cerebral infarction)    Left MCA distribution  . Essential hypertension   . Hemothorax on right   . Hyperlipidemia   . PAD (peripheral artery disease) ([facility])    Status post bilateral BKA  . Type 2 diabetes mellitus ([facility])    Past Surgical History:  Procedure Laterality Date  . Hemothorax drainage  08/18/2008  . THORACOTOMY     Right     No  outpatient medications have been marked as taking for the 09/05/19 encounter (Appointment) with Jonelle Sidle, MD.     Allergies:   Hydrocodone-acetaminophen, Lorazepam, and Oxycodone-acetaminophen   Social History   Tobacco Use  . Smoking status: Former Smoker    Packs/day: 3.50    Years: 35.00    Pack years: 122.50    Types: Cigarettes    Start date: 06/06/1962    Quit date: 07/04/2008    Years since quitting: 11.1  . Smokeless tobacco: Never Used  . Tobacco comment: Started at age 16  Substance Use Topics  . Alcohol use: Yes    Alcohol/week: 0.0 standard drinks    Comment: 12 pack or more a day for years. Has now cut back a little to 9-10 daily. Never blacks out.  . Drug use: No     Family Hx: The patient's family history includes Aneurysm in his father; Diabetes in his mother and another family member; Hypertension in an other family member; Stroke in his sister.  ROS:   Please see the history of present illness.    *** All other systems reviewed and are negative.   Prior CV studies:   The following studies were reviewed today:  Echocardiogram 08/08/2018 [facility]): Mild LVH with mild chamber dilatation and LVEF 60 to 65%, mildly dilated right ventricle with normal contraction, moderate tricuspid regurgitation with PASP 58 mmHg, mild left atrial enlargement, mild mitral regurgitation, mildly thickened aortic valve without stenosis, normal aortic root size, no pericardial effusion.  Labs/Other Tests and  Data Reviewed:    EKG:  An ECG dated 08/08/2018 was personally reviewed today and demonstrated:  Atrial fibrillation with nonspecific ST-T changes.  Recent Labs:  January 2021: Potassium 4.5, BUN 27, creatinine 1.2, AST 15, ALT 11, troponin I 0.02, BNP 543, INR 1.2 hemoglobin 10.6, platelets 325  Wt Readings from Last 3 Encounters:  12/17/18 234 lb (106.1 kg)  10/25/18 240 lb (108.9 kg)  12/01/16 282 lb 6.4 oz (128.1 kg)     Objective:    Vital Signs:  There  were no vitals taken for this visit.   {HeartCare Virtual Exam (Optional):[phone]::"VITAL SIGNS:  reviewed"}  ASSESSMENT & PLAN:    1. ***  COVID-19 Education: The signs and symptoms of COVID-19 were discussed with the patient and how to seek care for testing (follow up with PCP or arrange E-visit).  ***The importance of social distancing was discussed today.  Time:   Today, I have spent *** minutes with the patient with telehealth technology discussing the above problems.     Medication Adjustments/Labs and Tests Ordered: Current medicines are reviewed at length with the patient today.  Concerns regarding medicines are outlined above.   Tests Ordered: No orders of the defined types were placed in this encounter.   Medication Changes: No orders of the defined types were placed in this encounter.   Follow Up:  {F/U [phone]} {follow up:15908}  Signed, Rozann Lesches, MD  09/04/2019 7:34 PM    [facility]

## 2019-09-05 ENCOUNTER — Telehealth: Payer: 59 | Admitting: Cardiology
